# Patient Record
Sex: Female | Born: 1965 | Race: White | Hispanic: No | Marital: Married | Smoking: Never smoker
Health system: Southern US, Community
[De-identification: ages and names within clinical notes are randomized; demographics above are authoritative.]

## PROBLEM LIST (undated history)

## (undated) DIAGNOSIS — J45909 Unspecified asthma, uncomplicated: Secondary | ICD-10-CM

## (undated) DIAGNOSIS — N83209 Unspecified ovarian cyst, unspecified side: Secondary | ICD-10-CM

## (undated) DIAGNOSIS — Z86010 Personal history of colonic polyps: Secondary | ICD-10-CM

## (undated) HISTORY — PX: TONSILLECTOMY AND ADENOIDECTOMY: SUR1326

## (undated) HISTORY — DX: Personal history of colonic polyps: Z86.010

## (undated) HISTORY — DX: Unspecified ovarian cyst, unspecified side: N83.209

## (undated) HISTORY — DX: Unspecified asthma, uncomplicated: J45.909

---

## 2002-01-14 ENCOUNTER — Encounter: Payer: Self-pay | Admitting: Family Medicine

## 2002-01-16 ENCOUNTER — Encounter: Payer: Self-pay | Admitting: Family Medicine

## 2006-06-02 ENCOUNTER — Encounter: Payer: Self-pay | Admitting: Family Medicine

## 2007-03-25 ENCOUNTER — Encounter: Payer: Self-pay | Admitting: Family Medicine

## 2007-04-01 ENCOUNTER — Encounter (INDEPENDENT_AMBULATORY_CARE_PROVIDER_SITE_OTHER): Payer: Self-pay | Admitting: *Deleted

## 2007-05-11 ENCOUNTER — Ambulatory Visit: Payer: Self-pay | Admitting: Family Medicine

## 2007-05-11 ENCOUNTER — Encounter: Payer: Self-pay | Admitting: Family Medicine

## 2007-05-11 ENCOUNTER — Other Ambulatory Visit: Admission: RE | Admit: 2007-05-11 | Discharge: 2007-05-11 | Payer: Self-pay | Admitting: Family Medicine

## 2007-05-20 ENCOUNTER — Encounter (INDEPENDENT_AMBULATORY_CARE_PROVIDER_SITE_OTHER): Payer: Self-pay | Admitting: *Deleted

## 2007-05-25 LAB — CONVERTED CEMR LAB
AST: 37 units/L (ref 0–37)
Bilirubin, Direct: 0.1 mg/dL (ref 0.0–0.3)
CO2: 30 meq/L (ref 19–32)
Chloride: 103 meq/L (ref 96–112)
Creatinine, Ser: 0.8 mg/dL (ref 0.4–1.2)
Direct LDL: 128.2 mg/dL
Eosinophils Relative: 2.8 % (ref 0.0–5.0)
Glucose, Bld: 93 mg/dL (ref 70–99)
HCT: 44 % (ref 36.0–46.0)
Hemoglobin: 14.6 g/dL (ref 12.0–15.0)
Neutrophils Relative %: 40.3 % — ABNORMAL LOW (ref 43.0–77.0)
RBC: 4.8 M/uL (ref 3.87–5.11)
RDW: 12.3 % (ref 11.5–14.6)
Sodium: 139 meq/L (ref 135–145)
Total Bilirubin: 0.8 mg/dL (ref 0.3–1.2)
Total CHOL/HDL Ratio: 2.7
Total Protein: 7.4 g/dL (ref 6.0–8.3)
Triglycerides: 75 mg/dL (ref 0–149)
WBC: 7.5 10*3/uL (ref 4.5–10.5)

## 2007-06-10 ENCOUNTER — Encounter: Payer: Self-pay | Admitting: Family Medicine

## 2007-06-23 ENCOUNTER — Telehealth (INDEPENDENT_AMBULATORY_CARE_PROVIDER_SITE_OTHER): Payer: Self-pay | Admitting: *Deleted

## 2007-06-30 ENCOUNTER — Encounter: Payer: Self-pay | Admitting: Family Medicine

## 2008-11-02 ENCOUNTER — Ambulatory Visit: Payer: Self-pay | Admitting: Family Medicine

## 2008-11-02 DIAGNOSIS — K602 Anal fissure, unspecified: Secondary | ICD-10-CM | POA: Insufficient documentation

## 2009-02-06 ENCOUNTER — Encounter: Payer: Self-pay | Admitting: Family Medicine

## 2009-03-05 ENCOUNTER — Ambulatory Visit: Payer: Self-pay | Admitting: Family Medicine

## 2009-03-05 ENCOUNTER — Other Ambulatory Visit: Admission: RE | Admit: 2009-03-05 | Discharge: 2009-03-05 | Payer: Self-pay | Admitting: Family Medicine

## 2009-08-21 ENCOUNTER — Encounter: Payer: Self-pay | Admitting: Family Medicine

## 2010-04-22 NOTE — Miscellaneous (Signed)
Summary: mammogram normal  Clinical Lists Changes  Observations: Added new observation of MAMMRECACT: Screening mammogram in 1 year.    (08/20/2009 16:57) Added new observation of MAMMOGRAM: Location: [facility].   No specific mammographic evidence of malignancy.  No significant changes compared to previous study.  Assessment: BIRADS 1.  (08/20/2009 16:57)      Mammogram  Procedure date:  08/20/2009  Findings:      Location: [facility].   No specific mammographic evidence of malignancy.  No significant changes compared to previous study.  Assessment: BIRADS 1.   Comments:      Screening mammogram in 1 year.      Mammogram  Procedure date:  08/20/2009  Findings:      Location: [facility].   No specific mammographic evidence of malignancy.  No significant changes compared to previous study.  Assessment: BIRADS 1.   Comments:      Screening mammogram in 1 year.

## 2010-09-10 ENCOUNTER — Encounter: Payer: Self-pay | Admitting: Family Medicine

## 2011-04-02 ENCOUNTER — Ambulatory Visit (INDEPENDENT_AMBULATORY_CARE_PROVIDER_SITE_OTHER): Payer: PRIVATE HEALTH INSURANCE | Admitting: Family Medicine

## 2011-04-02 ENCOUNTER — Other Ambulatory Visit (HOSPITAL_COMMUNITY)
Admission: RE | Admit: 2011-04-02 | Discharge: 2011-04-02 | Disposition: A | Discharge status: Home / Self Care | Payer: PRIVATE HEALTH INSURANCE | Source: Ambulatory Visit | Attending: Family Medicine | Admitting: Family Medicine

## 2011-04-02 VITALS — BP 118/78 | HR 68 | Temp 98.1°F | Ht 67.0 in | Wt 144.0 lb

## 2011-04-02 DIAGNOSIS — Z01419 Encounter for gynecological examination (general) (routine) without abnormal findings: Secondary | ICD-10-CM | POA: Insufficient documentation

## 2011-04-02 DIAGNOSIS — Z0000 Encounter for general adult medical examination without abnormal findings: Secondary | ICD-10-CM

## 2011-04-02 DIAGNOSIS — Z1159 Encounter for screening for other viral diseases: Secondary | ICD-10-CM | POA: Insufficient documentation

## 2011-04-02 MED ORDER — NUVAIL EX SOLN: 1.0000 "application " | Freq: Every day | CUTANEOUS | Status: DC

## 2011-04-02 NOTE — Patient Instructions (Signed)
Start a regular exercise program and make sure you are eating a healthy diet Try to eat 4 servings of dairy a day or take a calcium supplement (500mg twice a day). Your vaccines are up to date.  We will call you with your lab results. If you don't here from us in about a week then please give us a call at [phone].  

## 2011-04-02 NOTE — Progress Notes (Signed)
Subjective:    Patient ID: Shannon Carr, female    DOB: 08-12-1965, 46 y.o.   MRN: [id number]  HPI   Review of Systems     Objective:   Physical Exam        Assessment & Plan:   Subjective:     Shannon Carr is a 46 y.o. female and is here for a comprehensive physical exam. The patient reports problems - urethritis for 2 weeks.  right flank pain.  REcenlty had. She reported urethritis for about 2 weeks. She said she probably did a urine dipstick and it showed protein and blood. Her symptoms seemed to resolve completely so she never actually treated herself for infection. She read that her urine although later and it still showed protein but no blood. She finally did a thorough dipstick that was normal. She was questioning whether or not the test strips may have been faulty at work. She's also had some intermittent right flank pain but she feels it is musculoskeletal. She has been playing a lot of tennis and she is right-handed. She says it is worse with movement and with deep palpation. She does have a history of a kidney stone during one of her pregnancies but has not had any problems since then. She denies any gross hematuria. No history of abnormal Pap smears. She says her mammogram was 2 months ago and was normal. She recently had 2 friends that were diagnosed leukemia she is nervous about this.  She would like to try new product called iNuvail. She is very brittle nails and would like to try using this to see if this helps.  History   Social History  . Marital Status: Married    Spouse Name: N/A    Number of Children: N/A  . Years of Education: N/A   Occupational History  . Not on file.   Social History Main Topics  . Smoking status: Not on file  . Smokeless tobacco: Not on file  . Alcohol Use: Not on file  . Drug Use: Not on file  . Sexually Active: Not on file   Other Topics Concern  . Not on file   Social History Narrative  . No narrative on file   Health  Maintenance  Topic Date Due  . Pap Smear  12/07/1983  . Influenza Vaccine  12/22/2010  . Mammogram  08/26/2011  . Tetanus/tdap  05/10/2017    The following portions of the patient's history were reviewed and updated as appropriate: allergies, current medications, past family history, past medical history, past social history, past surgical history and problem list.  Review of Systems A comprehensive review of systems was negative.   Objective:    BP 118/78  Pulse 68  Temp(Src) 98.1 F (36.7 C) (Oral)  Ht 5\' 7"  (1.702 m)  Wt 144 lb (65.318 kg)  BMI 22.55 kg/m2 General appearance: alert, cooperative and appears stated age Head: Normocephalic, without obvious abnormality, atraumatic Eyes: Conjunctiva clear, extraocular movements intact, pupils equal round reactive to light. Ears: normal TM's and external ear canals both ears Nose: Nares normal. Septum midline. Mucosa normal. No drainage or sinus tenderness. Throat: lips, mucosa, and tongue normal; teeth and gums normal Neck: no adenopathy, no carotid bruit, no JVD, supple, symmetrical, trachea midline and thyroid not enlarged, symmetric, no tenderness/mass/nodules Back: symmetric, no curvature. ROM normal. No CVA tenderness. Lungs: clear to auscultation bilaterally Breasts: normal appearance, no masses or tenderness Heart: regular rate and rhythm, S1, S2 normal, no murmur, click, rub  or gallop Abdomen: soft, non-tender; bowel sounds normal; no masses,  no organomegaly Pelvic: cervix normal in appearance, external genitalia normal, no adnexal masses or tenderness, no cervical motion tenderness, rectovaginal septum normal, uterus normal size, shape, and consistency and vagina normal without discharge Extremities: extremities normal, atraumatic, no cyanosis or edema Pulses: 2+ and symmetric Skin: Skin color, texture, turgor normal. No rashes or lesions Lymph nodes: Cervical, supraclavicular, and axillary nodes normal. Neurologic: Grossly  normal    Assessment:    Healthy female exam.  Plan:     See After Visit Summary for Counseling Recommendations  Start a regular exercise program and make sure you are eating a healthy diet Try to eat 4 servings of dairy a day or take a calcium supplement (500mg  twice a day). Your vaccines are up to date.   Brittle nails-we will try the new product called Nuvail. It not sure about the causes of the new medication. Asked to let me know she feels it works.  Microscopic hematuria/urinary-recommend repeating a urinalysis today with her physical.  Due for screening cholesterol. She also requests that we check a CBC.  She also complains of stress incontinence which she has had for years since having children. She went up any recommendations for urologic surgeons in the area. I gave her the name of the physician Durwin Nora a physician in [place] that she might consider. She is at the point that she would like to have surgical intervention before she becomes postmenopausal.

## 2011-04-03 LAB — CBC
MCH: 30.7 pg (ref 26.0–34.0)
MCHC: 34.2 g/dL (ref 30.0–36.0)
Platelets: 231 10*3/uL (ref 150–400)
RDW: 13 % (ref 11.5–15.5)

## 2011-04-03 LAB — URINALYSIS, ROUTINE W REFLEX MICROSCOPIC
Hgb urine dipstick: NEGATIVE
Ketones, ur: NEGATIVE mg/dL
Leukocytes, UA: NEGATIVE
Nitrite: NEGATIVE
Protein, ur: NEGATIVE mg/dL

## 2011-04-03 LAB — COMPLETE METABOLIC PANEL WITH GFR
AST: 21 U/L (ref 0–37)
Albumin: 5 g/dL (ref 3.5–5.2)
Alkaline Phosphatase: 50 U/L (ref 39–117)
BUN: 16 mg/dL (ref 6–23)
Creat: 0.85 mg/dL (ref 0.50–1.10)
Potassium: 4.3 mEq/L (ref 3.5–5.3)
Total Bilirubin: 0.8 mg/dL (ref 0.3–1.2)

## 2011-04-03 LAB — LIPID PANEL
Cholesterol: 229 mg/dL — ABNORMAL HIGH (ref 0–200)
HDL: 86 mg/dL (ref >39)
Total CHOL/HDL Ratio: 2.7 Ratio
Triglycerides: 59 mg/dL (ref <150)
VLDL: 12 mg/dL (ref 0–40)

## 2011-04-06 ENCOUNTER — Encounter: Payer: Self-pay | Admitting: Family Medicine

## 2011-04-06 DIAGNOSIS — N393 Stress incontinence (female) (male): Secondary | ICD-10-CM | POA: Insufficient documentation

## 2012-04-05 ENCOUNTER — Telehealth: Payer: Self-pay | Admitting: Family Medicine

## 2012-04-05 ENCOUNTER — Ambulatory Visit (INDEPENDENT_AMBULATORY_CARE_PROVIDER_SITE_OTHER): Payer: PRIVATE HEALTH INSURANCE | Admitting: Family Medicine

## 2012-04-05 ENCOUNTER — Encounter: Payer: Self-pay | Admitting: Family Medicine

## 2012-04-05 ENCOUNTER — Other Ambulatory Visit (HOSPITAL_COMMUNITY)
Admission: RE | Admit: 2012-04-05 | Discharge: 2012-04-05 | Disposition: A | Discharge status: Home / Self Care | Payer: PRIVATE HEALTH INSURANCE | Source: Ambulatory Visit | Attending: Family Medicine | Admitting: Family Medicine

## 2012-04-05 VITALS — BP 108/65 | HR 74 | Resp 18 | Ht 66.5 in | Wt 145.0 lb

## 2012-04-05 DIAGNOSIS — Z1151 Encounter for screening for human papillomavirus (HPV): Secondary | ICD-10-CM | POA: Insufficient documentation

## 2012-04-05 DIAGNOSIS — Z01419 Encounter for gynecological examination (general) (routine) without abnormal findings: Secondary | ICD-10-CM | POA: Insufficient documentation

## 2012-04-05 DIAGNOSIS — A609 Anogenital herpesviral infection, unspecified: Secondary | ICD-10-CM | POA: Insufficient documentation

## 2012-04-05 DIAGNOSIS — N393 Stress incontinence (female) (male): Secondary | ICD-10-CM

## 2012-04-05 DIAGNOSIS — Z23 Encounter for immunization: Secondary | ICD-10-CM

## 2012-04-05 DIAGNOSIS — Z124 Encounter for screening for malignant neoplasm of cervix: Secondary | ICD-10-CM

## 2012-04-05 MED ORDER — ACYCLOVIR 400 MG PO TABS: 800.0000 mg | ORAL_TABLET | Freq: Two times a day (BID) | ORAL | Status: DC

## 2012-04-05 NOTE — Telephone Encounter (Signed)
Please call pt with Dr. Ellin Saba name and number.  SHe wants to check it out before she makes appt.

## 2012-04-05 NOTE — Progress Notes (Signed)
Subjective:     Shannon Carr is a 47 y.o. female and is here for a comprehensive physical exam. The patient reports problems - having stress incontinenece. HAs to pee before she runs. Has never had surgery but is interested now. she has a hx of HSV.  Has been years since has had an outbreak.Marland Kitchen  History   Social History  . Marital Status: Married    Spouse Name: N/A    Number of Children: N/A  . Years of Education: N/A   Occupational History  . Nurse practitioner    Social History Main Topics  . Smoking status: Never Smoker   . Smokeless tobacco: Not on file  . Alcohol Use: Yes  . Drug Use: Not on file  . Sexually Active: Yes -- Female partner(s)   Other Topics Concern  . Not on file   Social History Narrative  . No narrative on file   Health Maintenance  Topic Date Due  . Mammogram  10/28/2012  . Influenza Vaccine  11/21/2012  . Pap Smear  04/01/2014  . Tetanus/tdap  05/10/2017    The following portions of the patient's history were reviewed and updated as appropriate: allergies, current medications, past family history, past medical history, past social history, past surgical history and problem list.  Review of Systems A comprehensive review of systems was negative.   Objective:    BP 108/65  Pulse 74  Resp 18  Ht 5' 6.5" (1.689 m)  Wt 145 lb (65.772 kg)  BMI 23.05 kg/m2  SpO2 98%  LMP 03/24/2012 General appearance: alert, cooperative and appears stated age Head: Normocephalic, without obvious abnormality, atraumatic Eyes: conj clear, EOMi, PEERLA Ears: normal TM's and external ear canals both ears Nose: Nares normal. Septum midline. Mucosa normal. No drainage or sinus tenderness. Throat: lips, mucosa, and tongue normal; teeth and gums normal Neck: no adenopathy, no carotid bruit, no JVD, supple, symmetrical, trachea midline and thyroid not enlarged, symmetric, no tenderness/mass/nodules Back: symmetric, no curvature. ROM normal. No CVA tenderness. Lungs: clear  to auscultation bilaterally Breasts: normal appearance, no masses or tenderness Heart: regular rate and rhythm, S1, S2 normal, no murmur, click, rub or gallop Abdomen: soft, non-tender; bowel sounds normal; no masses,  no organomegaly Pelvic: cervix normal in appearance, external genitalia normal, no adnexal masses or tenderness, no cervical motion tenderness, rectovaginal septum normal, uterus normal size, shape, and consistency and vagina normal without discharge Extremities: extremities normal, atraumatic, no cyanosis or edema Pulses: 2+ and symmetric Skin: Skin color, texture, turgor normal. No rashes or lesions Lymph nodes: Cervical, supraclavicular, and axillary nodes normal. Neurologic: Alert and oriented X 3, normal strength and tone. Normal symmetric reflexes. Normal coordination and gait    Assessment:    Healthy female exam.    Plan:     See After Visit Summary for Counseling Recommendations  Keep up a regular exercise program and make sure you are eating a healthy diet Try to eat 4 servings of dairy a day, or if you are lactose intolerant take a calcium with vitamin D daily.  Your vaccines are up to date.  Flu vaccine and mammogram are up-to-date. Pap smear performed today. We will call with results in a week. She had full labs last year and wants to hold off on doing this this year. Also encouraged her to log on to my chart.  HSV - prescription sent for generic acyclovir to use on a when necessary basis.  Stress incontinence-I will try to get some additional  information for her for specialist the area that focus primarily on pelvic reconstruction. We'll contact her as soon as I get that information.  She does have a tiny approximately 2-3 mm skin colored lesion at the 5:00 position around the rectum. It does not appear to be a cyst. There is no sign of erythema or infection. It is not pedunculated. It also does not appear to be wart-like.  I encouraged her to keep an eye  on it and call if she has any problems or discomfort or if it changes in size. Gave reassurance.

## 2012-04-05 NOTE — Telephone Encounter (Signed)
Please call GYn down the hall. Please see who Dr. Marice Potter recommends for pelvic reconstruction.  See if she knows someone who specalizes in this in the area.

## 2012-04-05 NOTE — Patient Instructions (Addendum)
Keep up a regular exercise program and make sure you are eating a healthy diet Try to eat 4 servings of dairy a day, or if you are lactose intolerant take a calcium with vitamin D daily.  Your vaccines are up to date.   

## 2012-04-05 NOTE — Telephone Encounter (Signed)
Dr. Marice Potter does do Pelvic reconstruction , just send the referral to her, PER Tonya in their office. Thanks, [employer]

## 2012-04-05 NOTE — Telephone Encounter (Signed)
I called and left a message for Dr. Marice Potter asking who she recommends for Pelvic reconstruction and IF she knows of anyone? Thanks

## 2012-04-06 NOTE — Telephone Encounter (Signed)
I called patient as you requested and gave her Dr.Dove's name, number and location. Patient told me to tell Dr. Linford Arnold thank you for getting back with her about this and checking on it for her. Thanks, [employer]

## 2012-09-21 ENCOUNTER — Other Ambulatory Visit: Payer: Self-pay | Admitting: *Deleted

## 2012-09-21 MED ORDER — ACYCLOVIR 400 MG PO TABS: 800.0000 mg | ORAL_TABLET | Freq: Two times a day (BID) | ORAL | Status: DC

## 2012-11-28 ENCOUNTER — Encounter: Payer: Self-pay | Admitting: Family Medicine

## 2012-12-21 HISTORY — PX: ANTERIOR AND POSTERIOR REPAIR: SHX1172

## 2014-02-06 ENCOUNTER — Telehealth: Payer: Self-pay | Admitting: Family Medicine

## 2014-02-06 DIAGNOSIS — Z114 Encounter for screening for human immunodeficiency virus [HIV]: Secondary | ICD-10-CM

## 2014-02-06 DIAGNOSIS — Z1159 Encounter for screening for other viral diseases: Secondary | ICD-10-CM

## 2014-02-06 NOTE — Telephone Encounter (Signed)
Ms. Azzarello called. 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

## 2014-02-06 NOTE — Telephone Encounter (Signed)
lvm informing pt that order has been placed.Shannon Carr [place]

## 2014-02-07 ENCOUNTER — Other Ambulatory Visit: Payer: Self-pay | Admitting: *Deleted

## 2014-02-07 DIAGNOSIS — Z114 Encounter for screening for human immunodeficiency virus [HIV]: Secondary | ICD-10-CM

## 2014-02-07 DIAGNOSIS — Z1159 Encounter for screening for other viral diseases: Secondary | ICD-10-CM

## 2014-02-08 LAB — HIV ANTIBODY (ROUTINE TESTING W REFLEX): HIV 1&2 Ab, 4th Generation: NONREACTIVE

## 2014-03-07 ENCOUNTER — Encounter: Payer: Self-pay | Admitting: Family Medicine

## 2014-04-19 ENCOUNTER — Encounter: Payer: PRIVATE HEALTH INSURANCE | Admitting: Family Medicine

## 2014-06-20 ENCOUNTER — Ambulatory Visit (INDEPENDENT_AMBULATORY_CARE_PROVIDER_SITE_OTHER): Payer: PRIVATE HEALTH INSURANCE | Admitting: Family Medicine

## 2014-06-20 ENCOUNTER — Encounter: Payer: Self-pay | Admitting: Family Medicine

## 2014-06-20 ENCOUNTER — Other Ambulatory Visit (HOSPITAL_COMMUNITY)
Admission: RE | Admit: 2014-06-20 | Discharge: 2014-06-20 | Disposition: A | Discharge status: Home / Self Care | Payer: PRIVATE HEALTH INSURANCE | Source: Ambulatory Visit | Attending: Family Medicine | Admitting: Family Medicine

## 2014-06-20 VITALS — BP 119/74 | HR 76 | Ht 67.0 in | Wt 138.0 lb

## 2014-06-20 DIAGNOSIS — L819 Disorder of pigmentation, unspecified: Secondary | ICD-10-CM | POA: Diagnosis not present

## 2014-06-20 DIAGNOSIS — Z01411 Encounter for gynecological examination (general) (routine) with abnormal findings: Secondary | ICD-10-CM | POA: Insufficient documentation

## 2014-06-20 DIAGNOSIS — L709 Acne, unspecified: Secondary | ICD-10-CM | POA: Diagnosis not present

## 2014-06-20 DIAGNOSIS — Z1151 Encounter for screening for human papillomavirus (HPV): Secondary | ICD-10-CM | POA: Diagnosis present

## 2014-06-20 DIAGNOSIS — Z01419 Encounter for gynecological examination (general) (routine) without abnormal findings: Secondary | ICD-10-CM

## 2014-06-20 DIAGNOSIS — L814 Other melanin hyperpigmentation: Secondary | ICD-10-CM

## 2014-06-20 MED ORDER — ACYCLOVIR 400 MG PO TABS: 800.0000 mg | ORAL_TABLET | Freq: Two times a day (BID) | ORAL | Status: DC | PRN

## 2014-06-20 MED ORDER — TRETINOIN 0.05 % EX CREA: TOPICAL_CREAM | Freq: Every day | CUTANEOUS | Status: DC

## 2014-06-20 MED ORDER — LUSTRA-AF 4 % EX CREA: 1.0000 "application " | TOPICAL_CREAM | Freq: Every day | CUTANEOUS | Status: DC

## 2014-06-20 NOTE — Patient Instructions (Signed)
Keep up a regular exercise program and make sure you are eating a healthy diet Try to eat 4 servings of dairy a day, or if you are lactose intolerant take a calcium with vitamin D daily.  Your vaccines are up to date.   

## 2014-06-20 NOTE — Progress Notes (Signed)
  Subjective:     Shannon Carr is a 49 y.o. female and is here for a comprehensive physical exam. The patient reports no problems.  History   Social History  . Marital Status: Married    Spouse Name: N/A  . Number of Children: 3  . Years of Education: N/A   Occupational History  . Nurse practitioner    Social History Main Topics  . Smoking status: Never Smoker   . Smokeless tobacco: Not on file  . Alcohol Use: Yes  . Drug Use: No  . Sexual Activity:    Partners: Male   Other Topics Concern  . Not on file   Social History Narrative   Work in [employer]. Works out regularly.    Health Maintenance  Topic Date Due  . INFLUENZA VACCINE  10/21/2013  . MAMMOGRAM  02/07/2015  . PAP SMEAR  04/06/2015  . TETANUS/TDAP  05/10/2017  . HIV Screening  Completed    The following portions of the patient's history were reviewed and updated as appropriate: allergies, current medications, past family history, past medical history, past social history, past surgical history and problem list.  Review of Systems A comprehensive review of systems was negative.   Objective:    BP 119/74 mmHg  Pulse 76  Ht 5\' 7"  (1.702 m)  Wt 138 lb (62.596 kg)  BMI 21.61 kg/m2 General appearance: alert, cooperative and appears stated age Head: Normocephalic, without obvious abnormality, atraumatic Eyes: conj clear, EOMI, PEERLA Ears: normal TM's and external ear canals both ears Nose: Nares normal. Septum midline. Mucosa normal. No drainage or sinus tenderness. Throat: lips, mucosa, and tongue normal; teeth and gums normal Neck: no adenopathy, no carotid bruit, no JVD, supple, symmetrical, trachea midline and thyroid not enlarged, symmetric, no tenderness/mass/nodules Back: symmetric, no curvature. ROM normal. No CVA tenderness. Lungs: clear to auscultation bilaterally Breasts: normal appearance, no masses or tenderness Heart: regular rate and rhythm, S1, S2 normal, no murmur, click, rub or  gallop Abdomen: soft, non-tender; bowel sounds normal; no masses,  no organomegaly Pelvic: cervix normal in appearance, external genitalia normal, no adnexal masses or tenderness, no cervical motion tenderness, rectovaginal septum normal, uterus normal size, shape, and consistency and vagina normal without discharge Extremities: extremities normal, atraumatic, no cyanosis or edema Pulses: 2+ and symmetric Skin: Skin color, texture, turgor normal. No rashes or lesions Lymph nodes: Cervical, supraclavicular, and axillary nodes normal. Neurologic: Alert and oriented X 3, normal strength and tone. Normal symmetric reflexes. Normal coordination and gait    Assessment:    Healthy female exam.      Plan:     See After Visit Summary for Counseling Recommendations  Keep up a regular exercise program and make sure you are eating a healthy diet Try to eat 4 servings of dairy a day, or if you are lactose intolerant take a calcium with vitamin D daily.  Your vaccines are up to date.    Acne/solar lentigo - would like refill on retin-A and lustra-AF.

## 2014-06-21 ENCOUNTER — Telehealth: Payer: Self-pay | Admitting: Family Medicine

## 2014-06-21 NOTE — Telephone Encounter (Signed)
Received fax for pa on trentinoin and sent through cover my meds waiting on auth. - CF

## 2014-06-22 LAB — CYTOLOGY - PAP

## 2014-06-25 NOTE — Telephone Encounter (Signed)
Called [employer] and got the trentinoin 0.05 % Cream approved Auth # [id number] from 06/25/2014 - 06/25/2015 - CF

## 2014-06-25 NOTE — Telephone Encounter (Signed)
Received fax on Tretinoin and request has been denied - CF

## 2015-03-21 ENCOUNTER — Encounter: Payer: Self-pay | Admitting: Family Medicine

## 2015-03-24 HISTORY — PX: COLONOSCOPY: SHX174

## 2015-05-28 ENCOUNTER — Ambulatory Visit (INDEPENDENT_AMBULATORY_CARE_PROVIDER_SITE_OTHER): Payer: PRIVATE HEALTH INSURANCE

## 2015-05-28 ENCOUNTER — Encounter: Payer: Self-pay | Admitting: Family Medicine

## 2015-05-28 ENCOUNTER — Ambulatory Visit (INDEPENDENT_AMBULATORY_CARE_PROVIDER_SITE_OTHER): Payer: PRIVATE HEALTH INSURANCE | Admitting: Family Medicine

## 2015-05-28 VITALS — BP 106/75 | HR 62 | Temp 98.2°F | Ht 66.5 in | Wt 134.0 lb

## 2015-05-28 DIAGNOSIS — A609 Anogenital herpesviral infection, unspecified: Secondary | ICD-10-CM

## 2015-05-28 DIAGNOSIS — R1032 Left lower quadrant pain: Secondary | ICD-10-CM

## 2015-05-28 DIAGNOSIS — Z1211 Encounter for screening for malignant neoplasm of colon: Secondary | ICD-10-CM | POA: Diagnosis not present

## 2015-05-28 DIAGNOSIS — Z0000 Encounter for general adult medical examination without abnormal findings: Secondary | ICD-10-CM

## 2015-05-28 LAB — COMPLETE METABOLIC PANEL WITH GFR
ALBUMIN: 4.6 g/dL (ref 3.6–5.1)
ALK PHOS: 41 U/L (ref 33–115)
ALT: 15 U/L (ref 6–29)
AST: 16 U/L (ref 10–35)
BUN: 11 mg/dL (ref 7–25)
CALCIUM: 9.7 mg/dL (ref 8.6–10.2)
CHLORIDE: 101 mmol/L (ref 98–110)
CO2: 24 mmol/L (ref 20–31)
CREATININE: 1.02 mg/dL (ref 0.50–1.10)
GFR, Est African American: 75 mL/min (ref >=60)
GFR, Est Non African American: 65 mL/min (ref >=60)
GLUCOSE: 89 mg/dL (ref 65–99)
Potassium: 3.9 mmol/L (ref 3.5–5.3)
SODIUM: 139 mmol/L (ref 135–146)
Total Bilirubin: 0.9 mg/dL (ref 0.2–1.2)
Total Protein: 7.3 g/dL (ref 6.1–8.1)

## 2015-05-28 LAB — CBC WITH DIFFERENTIAL/PLATELET
Basophils Absolute: 0 10*3/uL (ref 0.0–0.1)
Basophils Relative: 0 % (ref 0–1)
EOS PCT: 2 % (ref 0–5)
Eosinophils Absolute: 0.2 10*3/uL (ref 0.0–0.7)
HEMATOCRIT: 43.2 % (ref 36.0–46.0)
Hemoglobin: 15 g/dL (ref 12.0–15.0)
LYMPHS ABS: 3.9 10*3/uL (ref 0.7–4.0)
Lymphocytes Relative: 44 % (ref 12–46)
MCH: 30.9 pg (ref 26.0–34.0)
MCHC: 34.7 g/dL (ref 30.0–36.0)
MCV: 88.9 fL (ref 78.0–100.0)
MONO ABS: 0.9 10*3/uL (ref 0.1–1.0)
MPV: 11.2 fL (ref 8.6–12.4)
Monocytes Relative: 10 % (ref 3–12)
NEUTROS PCT: 44 % (ref 43–77)
Neutro Abs: 3.9 10*3/uL (ref 1.7–7.7)
Platelets: 217 10*3/uL (ref 150–400)
RBC: 4.86 MIL/uL (ref 3.87–5.11)
RDW: 13.2 % (ref 11.5–15.5)
WBC: 8.8 10*3/uL (ref 4.0–10.5)

## 2015-05-28 LAB — LIPID PANEL
CHOL/HDL RATIO: 2.2 ratio (ref <=5.0)
CHOLESTEROL: 222 mg/dL — AB (ref 125–200)
HDL: 103 mg/dL (ref >=46)
LDL Cholesterol: 104 mg/dL (ref <130)
Triglycerides: 76 mg/dL (ref <150)
VLDL: 15 mg/dL (ref <30)

## 2015-05-28 LAB — TSH: TSH: 2.15 mIU/L

## 2015-05-28 MED ORDER — ACYCLOVIR 400 MG PO TABS: 800.0000 mg | ORAL_TABLET | Freq: Two times a day (BID) | ORAL | Status: DC | PRN

## 2015-05-28 NOTE — Progress Notes (Signed)
  Subjective:     Shannon Carr is a 50 y.o. female and is here for a comprehensive physical exam. The patient reports problems - pain in her LLQ on and  off for 6 months. Feels similar to [name].  Periods have been regular.   She has had a full skin cancer screening.    She has had her mammogram. Recall in 6 months.     Social History   Social History  . Marital Status: Married    Spouse Name: N/A  . Number of Children: 3  . Years of Education: N/A   Occupational History  . Nurse practitioner    Social History Main Topics  . Smoking status: Never Smoker   . Smokeless tobacco: Not on file  . Alcohol Use: Yes  . Drug Use: No  . Sexual Activity:    Partners: Male   Other Topics Concern  . Not on file   Social History Narrative   Work in [employer]. Works out regularly.    Health Maintenance  Topic Date Due  . INFLUENZA VACCINE  10/22/2015  . MAMMOGRAM  02/21/2016  . TETANUS/TDAP  05/10/2017  . PAP SMEAR  06/19/2017  . HIV Screening  Completed    The following portions of the patient's history were reviewed and updated as appropriate: allergies, current medications, past family history, past medical history, past social history, past surgical history and problem list.  Review of Systems A comprehensive review of systems was negative.   Objective:    BP 106/75 mmHg  Pulse 62  Temp(Src) 98.2 F (36.8 C)  Wt 134 lb (60.782 kg)  SpO2 98% General appearance: alert, cooperative and appears stated age Head: Normocephalic, without obvious abnormality, atraumatic Eyes: conj clear, EOMi, PEERLA Ears: normal TM's and external ear canals both ears Nose: Nares normal. Septum midline. Mucosa normal. No drainage or sinus tenderness. Throat: lips, mucosa, and tongue normal; teeth and gums normal Neck: no adenopathy, no carotid bruit, no JVD, supple, symmetrical, trachea midline and thyroid not enlarged, symmetric, no tenderness/mass/nodules Back: symmetric, no  curvature. ROM normal. No CVA tenderness. Lungs: clear to auscultation bilaterally Breasts: normal appearance, no masses or tenderness Heart: regular rate and rhythm, S1, S2 normal, no murmur, click, rub or gallop Abdomen: soft, non-tender; bowel sounds normal; no masses,  no organomegaly Pelvic: cervix normal in appearance, external genitalia normal, no adnexal masses or tenderness, no cervical motion tenderness, rectovaginal septum normal, uterus normal size, shape, and consistency and vagina normal without discharge Extremities: extremities normal, atraumatic, no cyanosis or edema Pulses: 2+ and symmetric Skin: Skin color, texture, turgor normal. No rashes or lesions Lymph nodes: Cervical, supraclavicular, and axillary nodes normal. Neurologic: Alert and oriented X 3, normal strength and tone. Normal symmetric reflexes. Normal coordination and gait    Assessment:    Healthy female exam.      Plan:     See After Visit Summary for Counseling Recommendations    Keep up a regular exercise program and make sure you are eating a healthy diet Try to eat 4 servings of dairy a day, or if you are lactose intolerant take a calcium with vitamin D daily.  Your vaccines are up to date.   LLQ pain - will schedule for [place] for further evaluation.     Discussed need for colon cancer screening.  Will refer.

## 2015-05-28 NOTE — Patient Instructions (Signed)
Keep up a regular exercise program and make sure you are eating a healthy diet Try to eat 4 servings of dairy a day, or if you are lactose intolerant take a calcium with vitamin D daily.  Your vaccines are up to date.   

## 2015-09-03 ENCOUNTER — Other Ambulatory Visit: Payer: Self-pay | Admitting: Family Medicine

## 2015-09-03 DIAGNOSIS — R928 Other abnormal and inconclusive findings on diagnostic imaging of breast: Secondary | ICD-10-CM

## 2015-09-03 NOTE — Progress Notes (Signed)
Per [facility] request.

## 2015-09-09 LAB — HM MAMMOGRAPHY

## 2015-10-03 ENCOUNTER — Encounter: Payer: Self-pay | Admitting: Family Medicine

## 2015-12-23 ENCOUNTER — Encounter: Payer: Self-pay | Admitting: Internal Medicine

## 2015-12-23 ENCOUNTER — Telehealth: Payer: Self-pay | Admitting: Family Medicine

## 2015-12-23 DIAGNOSIS — Z1211 Encounter for screening for malignant neoplasm of colon: Secondary | ICD-10-CM

## 2015-12-23 NOTE — Telephone Encounter (Signed)
Pt called to request a referral for colonoscopy. Pt states her deductible has been met so would like to go ahead and get this completed. Will place order.

## 2016-01-02 ENCOUNTER — Ambulatory Visit (INDEPENDENT_AMBULATORY_CARE_PROVIDER_SITE_OTHER): Payer: PRIVATE HEALTH INSURANCE

## 2016-01-02 ENCOUNTER — Encounter: Payer: Self-pay | Admitting: Podiatry

## 2016-01-02 ENCOUNTER — Ambulatory Visit (INDEPENDENT_AMBULATORY_CARE_PROVIDER_SITE_OTHER): Payer: PRIVATE HEALTH INSURANCE | Admitting: Podiatry

## 2016-01-02 VITALS — BP 115/78 | HR 64 | Resp 16

## 2016-01-02 DIAGNOSIS — M79671 Pain in right foot: Secondary | ICD-10-CM | POA: Diagnosis not present

## 2016-01-02 DIAGNOSIS — M722 Plantar fascial fibromatosis: Secondary | ICD-10-CM

## 2016-01-02 DIAGNOSIS — M779 Enthesopathy, unspecified: Principal | ICD-10-CM

## 2016-01-02 DIAGNOSIS — M778 Other enthesopathies, not elsewhere classified: Secondary | ICD-10-CM

## 2016-01-02 DIAGNOSIS — M79672 Pain in left foot: Secondary | ICD-10-CM

## 2016-01-02 DIAGNOSIS — M7752 Other enthesopathy of left foot: Secondary | ICD-10-CM | POA: Diagnosis not present

## 2016-01-02 NOTE — Progress Notes (Signed)
   Subjective:    Patient ID: Shannon Carr, female    DOB: 09/25/1965, 50 y.o.   MRN: [id number]  HPI: She presents today with a chief complaint of pain to the first metatarsophalangeal joint of the left foot. She states that has been aching for the past several weeks and noticed it primarily after doing high-impact forefoot activity for exercise. She states that she has pain on range of motion. She states that over the past week or so it has started to resolve and her symptoms are lessened. She is also complaining of pain to bilateral heels. She states that she has known morning pain but her heels disorder feel squishy she states that they've been tender for the past few weeks but she can states that they have her for months.    Review of Systems  All other systems reviewed and are negative.      Objective:   Physical Exam: Vital signs are stable alert and oriented 3. Pulses are strongly palpable. Neurologic sensorium is intact. Deep tendon reflexes are intact and muscle strength +5 over 5 dorsiflexion plantar flexors and inverters everters onto the musculature is intact. Orthopedic evaluation demonstrates all joints distal to the ankle for range of motion without crepitation. Cutaneous evaluation demonstrates supple well-hydrated cutis no open lesions or wounds. She has very little in the way of pain on palpation or range of motion of the first metatarsophalangeal joint left that there is some tenderness on palpation of the distracted joint. She also has some tenderness and bogginess on palpation of the plantar medial calcaneal tubercle and central calcaneal tubercle bilateral heels. Radiographs do demonstrate 3 views mature individual today soft tissue increase in density at the plantar fascia calcaneal insertion site of the left heel. This is minimal and there are no other signs of joint space narrowing fracture etc.        Assessment & Plan:  Assessment: Plantar fasciitis and probable  sesamoiditis with capsulitis that has resolved.  Plan: She was scanned for set of orthotics and I will follow-up with her in 1 month.

## 2016-02-04 ENCOUNTER — Ambulatory Visit (INDEPENDENT_AMBULATORY_CARE_PROVIDER_SITE_OTHER): Payer: PRIVATE HEALTH INSURANCE | Admitting: Podiatry

## 2016-02-04 DIAGNOSIS — M722 Plantar fascial fibromatosis: Secondary | ICD-10-CM

## 2016-02-04 NOTE — Patient Instructions (Signed)

## 2016-02-05 NOTE — Progress Notes (Signed)
She presents today for follow-up of her plantar fasciitis she states that she's doing pretty well in her orthotics that she just received today feels great. She was provided with both oral and ongoing instructions for care of these orthotics and I will follow-up with her in 4-5 weeks. Currently she states they feel very good.

## 2016-02-11 ENCOUNTER — Ambulatory Visit (AMBULATORY_SURGERY_CENTER): Payer: Self-pay | Admitting: *Deleted

## 2016-02-11 VITALS — Ht 67.0 in | Wt 146.0 lb

## 2016-02-11 DIAGNOSIS — Z1211 Encounter for screening for malignant neoplasm of colon: Secondary | ICD-10-CM

## 2016-02-11 NOTE — Progress Notes (Signed)
Patient denies any allergies to eggs or soy. Patient denies any problems with anesthesia/sedation. Patient denies any oxygen use at home and does not take any diet/weight loss medications. Patient declined EMMI education. 

## 2016-02-24 ENCOUNTER — Ambulatory Visit (AMBULATORY_SURGERY_CENTER): Payer: PRIVATE HEALTH INSURANCE | Admitting: Internal Medicine

## 2016-02-24 ENCOUNTER — Encounter: Payer: Self-pay | Admitting: Internal Medicine

## 2016-02-24 VITALS — BP 105/71 | HR 54 | Temp 98.0°F | Resp 11 | Ht 67.0 in | Wt 146.0 lb

## 2016-02-24 DIAGNOSIS — Z1211 Encounter for screening for malignant neoplasm of colon: Secondary | ICD-10-CM | POA: Diagnosis not present

## 2016-02-24 DIAGNOSIS — Z1212 Encounter for screening for malignant neoplasm of rectum: Secondary | ICD-10-CM | POA: Diagnosis not present

## 2016-02-24 DIAGNOSIS — D120 Benign neoplasm of cecum: Secondary | ICD-10-CM

## 2016-02-24 DIAGNOSIS — D122 Benign neoplasm of ascending colon: Secondary | ICD-10-CM

## 2016-02-24 DIAGNOSIS — D123 Benign neoplasm of transverse colon: Secondary | ICD-10-CM | POA: Diagnosis not present

## 2016-02-24 MED ORDER — SODIUM CHLORIDE 0.9 % IV SOLN: 500.0000 mL | INTRAVENOUS | Status: DC

## 2016-02-24 NOTE — Op Note (Signed)
[facility] Patient Name: Shannon Carr Procedure Date: 02/24/2016 1:22 PM MRN: [id number] Endoscopist: Gatha Mayer , MD Age: 50 Referring MD:  Date of Birth: 1965-09-18 Gender: Female Account #: [id number] Procedure:                Colonoscopy Indications:              Screening for colorectal malignant neoplasm, This                            is the patient's first colonoscopy Medicines:                Propofol per Anesthesia, Monitored Anesthesia Care Procedure:                Pre-Anesthesia Assessment:                           - Prior to the procedure, a History and Physical                            was performed, and patient medications and                            allergies were reviewed. The patient's tolerance of                            previous anesthesia was also reviewed. The risks                            and benefits of the procedure and the sedation                            options and risks were discussed with the patient.                            All questions were answered, and informed consent                            was obtained. Prior Anticoagulants: The patient has                            taken no previous anticoagulant or antiplatelet                            agents. ASA Grade Assessment: II - A patient with                            mild systemic disease. After reviewing the risks                            and benefits, the patient was deemed in                            satisfactory condition to undergo the procedure.  After obtaining informed consent, the colonoscope                            was passed under direct vision. Throughout the                            procedure, the patient's blood pressure, pulse, and                            oxygen saturations were monitored continuously. The                            Model CF-HQ190L [id number]) scope was introduced   through the anus and advanced to the the cecum,                            identified by appendiceal orifice and ileocecal                            valve. The colonoscopy was performed without                            difficulty. The patient tolerated the procedure                            well. The quality of the bowel preparation was                            excellent. The bowel preparation used was Miralax.                            The ileocecal valve, appendiceal orifice, and                            rectum were photographed. Scope In: 1:34:46 PM Scope Out: 2:00:01 PM Scope Withdrawal Time: 0 hours 18 minutes 51 seconds  Total Procedure Duration: 0 hours 25 minutes 15 seconds  Findings:                 The perianal and digital rectal examinations were                            normal.                           Six flat and sessile polyps were found in the                            transverse colon, ascending colon and cecum. The                            polyps were 5 to 10 mm in size. These polyps were                            removed with a cold  snare. Resection and retrieval                            were complete. Verification of patient                            identification for the specimen was done. Estimated                            blood loss was minimal.                           The exam was otherwise without abnormality on                            direct and retroflexion views. Complications:            No immediate complications. Estimated Blood Loss:     Estimated blood loss was minimal. Impression:               - Six 5 to 10 mm polyps in the transverse colon, in                            the ascending colon and in the cecum, removed with                            a cold snare. Resected and retrieved.                           - The examination was otherwise normal on direct                            and retroflexion views. Recommendation:            - Patient has a contact number available for                            emergencies. The signs and symptoms of potential                            delayed complications were discussed with the                            patient. Return to normal activities tomorrow.                            Written discharge instructions were provided to the                            patient.                           - Resume previous diet.                           - Continue present medications.                           -  Repeat colonoscopy is recommended. The                            colonoscopy date will be determined after pathology                            results from today's exam become available for                            review. Gatha Mayer, MD 02/24/2016 2:12:56 PM This report has been signed electronically.

## 2016-02-24 NOTE — Patient Instructions (Addendum)
I found and removed 6 polyps - all look benign. I will let you know pathology results and when to have another routine colonoscopy by mail.  I appreciate the opportunity to care for you. Gatha Mayer, MD, FACG   YOU HAD AN ENDOSCOPIC PROCEDURE TODAY AT [facility]:   Refer to the procedure report that was given to you for any specific questions about what was found during the examination.  If the procedure report does not answer your questions, please call your gastroenterologist to clarify.  If you requested that your care partner not be given the details of your procedure findings, then the procedure report has been included in a sealed envelope for you to review at your convenience later.  YOU SHOULD EXPECT: Some feelings of bloating in the abdomen. Passage of more gas than usual.  Walking can help get rid of the air that was put into your GI tract during the procedure and reduce the bloating. If you had a lower endoscopy (such as a colonoscopy or flexible sigmoidoscopy) you may notice spotting of blood in your stool or on the toilet paper. If you underwent a bowel prep for your procedure, you may not have a normal bowel movement for a few days.  Please Note:  You might notice some irritation and congestion in your nose or some drainage.  This is from the oxygen used during your procedure.  There is no need for concern and it should clear up in a day or so.  SYMPTOMS TO REPORT IMMEDIATELY:   Following lower endoscopy (colonoscopy or flexible sigmoidoscopy):  Excessive amounts of blood in the stool  Significant tenderness or worsening of abdominal pains  Swelling of the abdomen that is new, acute  Fever of 100F or higher   For urgent or emergent issues, a gastroenterologist can be reached at any hour by calling [phone].   DIET:  We do recommend a small meal at first, but then you may proceed to your regular diet.  Drink plenty of fluids but you should  avoid alcoholic beverages for 24 hours.  ACTIVITY:  You should plan to take it easy for the rest of today and you should NOT DRIVE or use heavy machinery until tomorrow (because of the sedation medicines used during the test).    FOLLOW UP: Our staff will call the number listed on your records the next business day following your procedure to check on you and address any questions or concerns that you may have regarding the information given to you following your procedure. If we do not reach you, we will leave a message.  However, if you are feeling well and you are not experiencing any problems, there is no need to return our call.  We will assume that you have returned to your regular daily activities without incident.  If any biopsies were taken you will be contacted by phone or by letter within the next 1-3 weeks.  Please call us at [phone] if you have not heard about the biopsies in 3 weeks.    SIGNATURES/CONFIDENTIALITY: You and/or your care partner have signed paperwork which will be entered into your electronic medical record.  These signatures attest to the fact that that the information above on your After Visit Summary has been reviewed and is understood.  Full responsibility of the confidentiality of this discharge information lies with you and/or your care-partner.  Read all of the handouts given to you by your recovery room  nurse.

## 2016-02-24 NOTE — Progress Notes (Signed)
Report to PACU, RN, vss, BBS= Clear.  

## 2016-02-24 NOTE — Progress Notes (Signed)
Called to room to assist during endoscopic procedure.  Patient ID and intended procedure confirmed with present staff. Received instructions for my participation in the procedure from the performing physician.  

## 2016-02-25 ENCOUNTER — Telehealth: Payer: Self-pay | Admitting: *Deleted

## 2016-02-25 NOTE — Telephone Encounter (Signed)
  Follow up Call-  Call back number 02/24/2016  Post procedure Call Back phone  # [phone]  Permission to leave phone message Yes  Some recent data might be hidden     Patient questions:  Do you have a fever, pain , or abdominal swelling? No. Pain Score  0 *  Have you tolerated food without any problems? Yes.    Have you been able to return to your normal activities? Yes.    Do you have any questions about your discharge instructions: Diet   No. Medications  No. Follow up visit  No.  Do you have questions or concerns about your Care? No.  Actions: * If pain score is 4 or above: No action needed, pain <4.

## 2016-02-27 ENCOUNTER — Encounter: Payer: Self-pay | Admitting: Internal Medicine

## 2016-02-27 DIAGNOSIS — Z86010 Personal history of colonic polyps: Secondary | ICD-10-CM

## 2016-02-27 DIAGNOSIS — Z860101 Personal history of adenomatous and serrated colon polyps: Secondary | ICD-10-CM

## 2016-02-27 HISTORY — DX: Personal history of colonic polyps: Z86.010

## 2016-02-27 HISTORY — DX: Personal history of adenomatous and serrated colon polyps: Z86.0101

## 2016-02-27 NOTE — Progress Notes (Signed)
6 adenomas max 10 mm Recall colonoscopy 2020 My Chart note to patient no letter

## 2016-04-11 IMAGING — US US TRANSVAGINAL NON-OB
1 series · 14 of 25 positions shown · non-contrast
Comparison: None

CLINICAL DATA: Patient with left lower quadrant pain for 6 months.

EXAM:
TRANSABDOMINAL AND TRANSVAGINAL ULTRASOUND OF PELVIS
TECHNIQUE: Both transabdominal and transvaginal ultrasound examinations of the
pelvis were performed. Transabdominal technique was performed for
global imaging of the pelvis including uterus, ovaries, adnexal
regions, and pelvic cul-de-sac. It was necessary to proceed with
endovaginal exam following the transabdominal exam to visualize the
adnexal structures.

[Series 1: us transvaginal non-ob · 0.22mm/px · 14 of 69 slices shown]
[im 1/69]
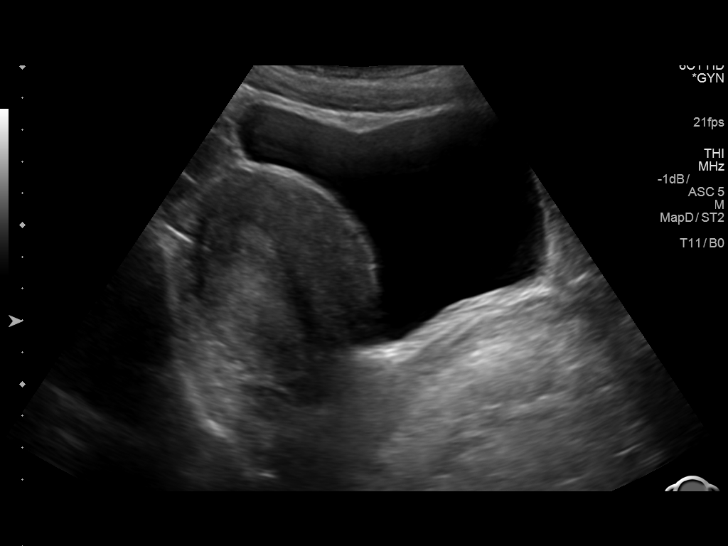
[im 6/69]
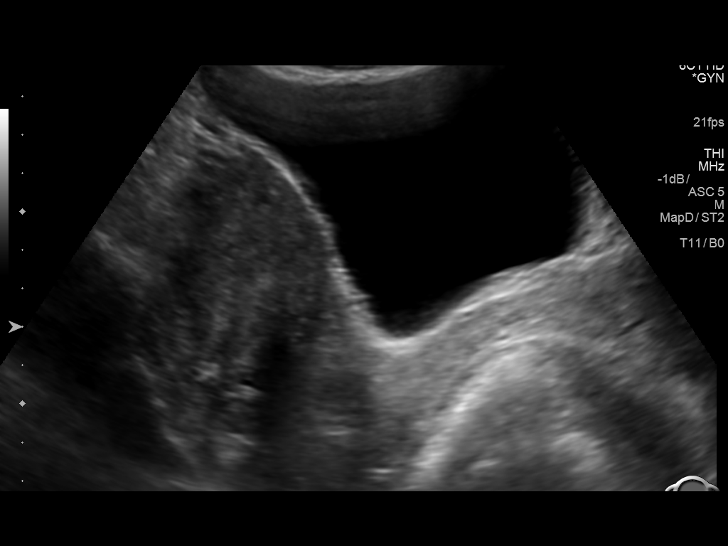
[im 12/69]
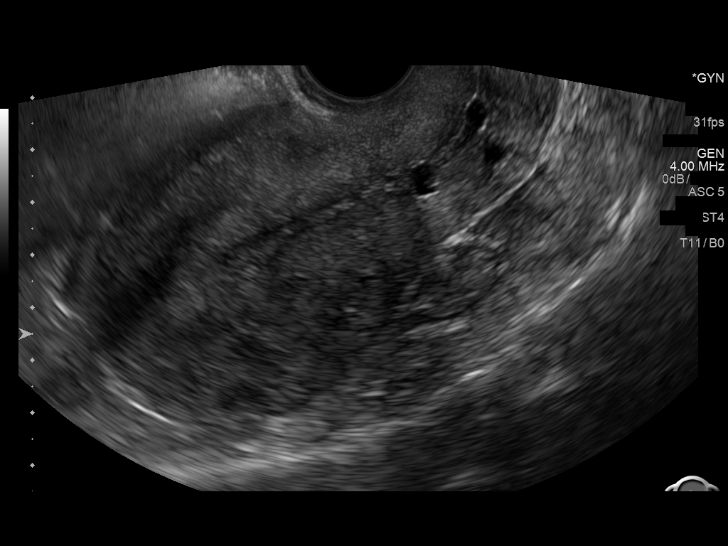
[im 18/69]
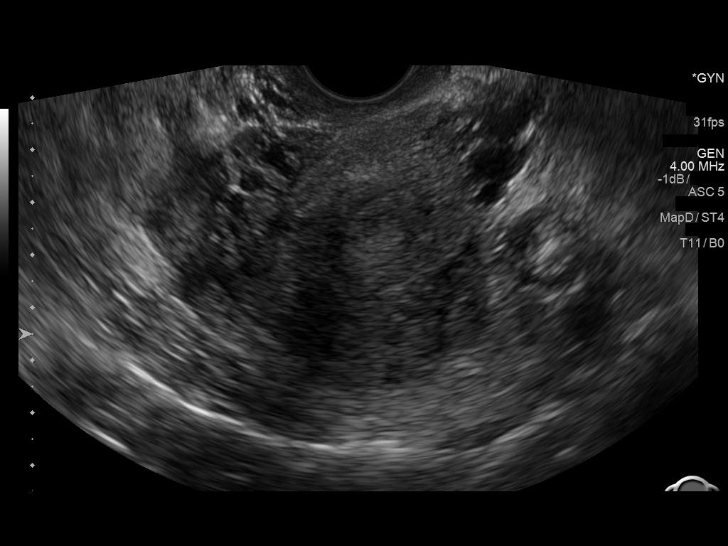
[im 23/69]
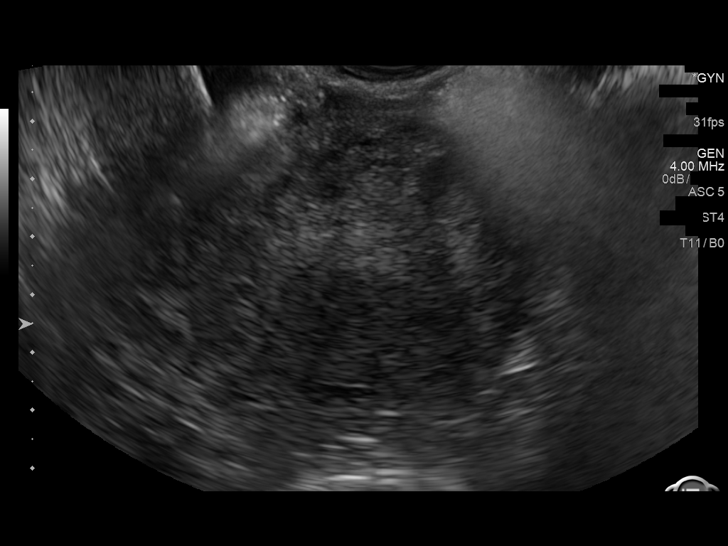
[im 26/69]
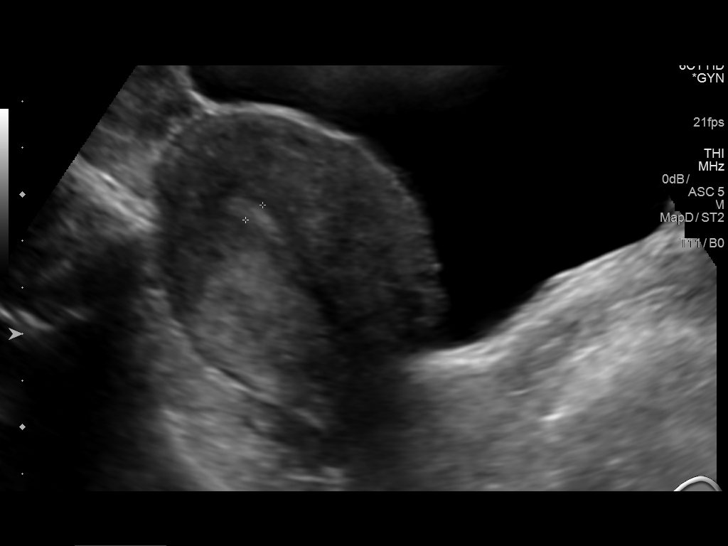
[im 32/69]
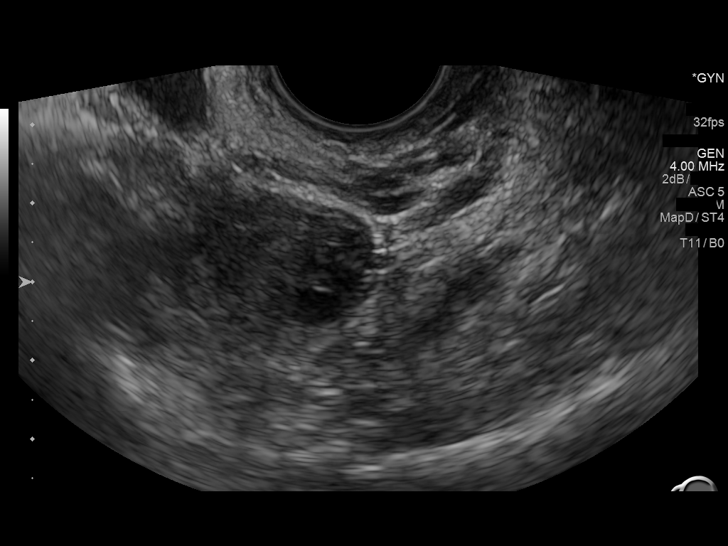
[im 37/69]
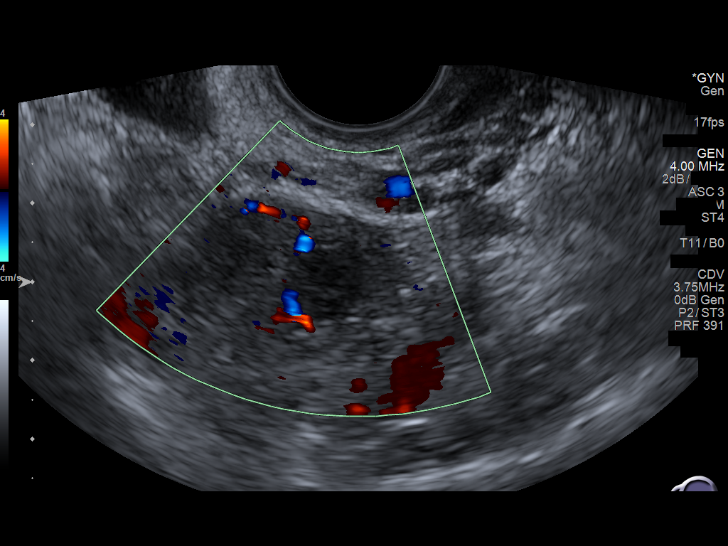
[im 43/69]
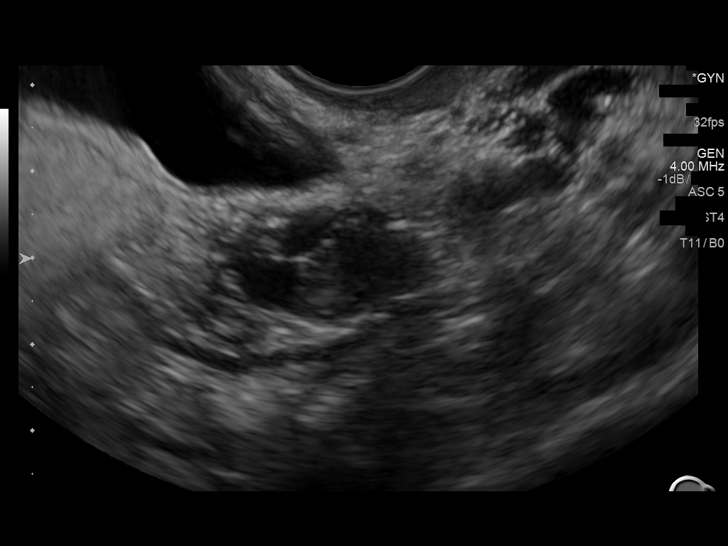
[im 46/69]
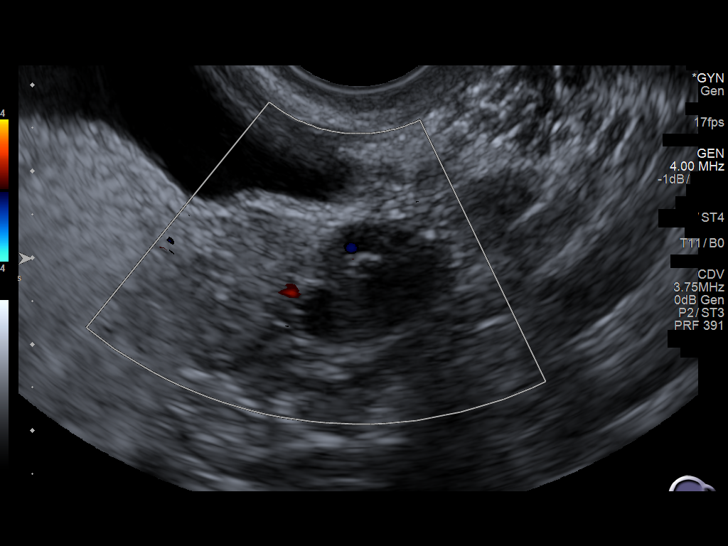
[im 52/69]
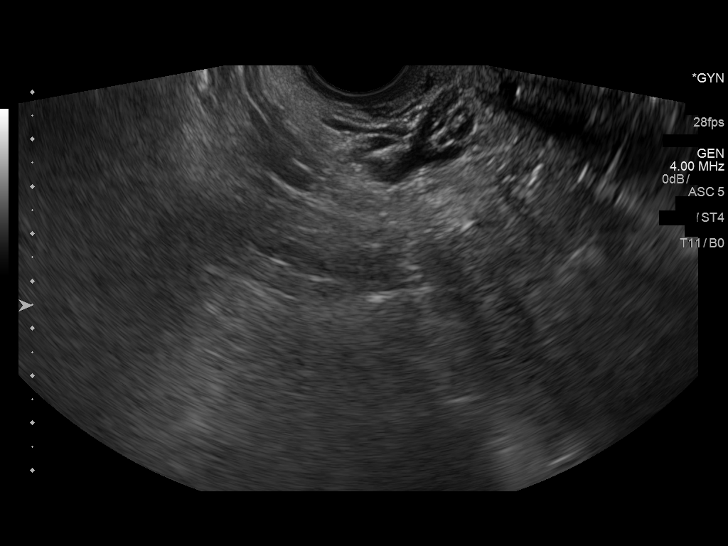
[im 57/69]
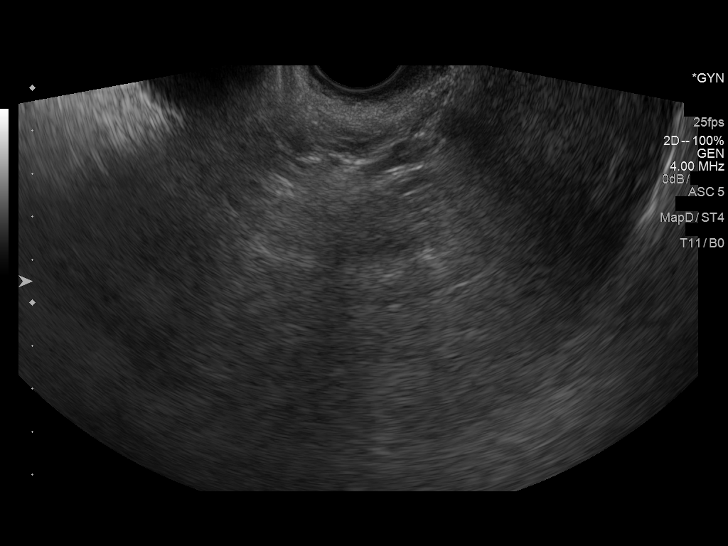
[im 63/69]
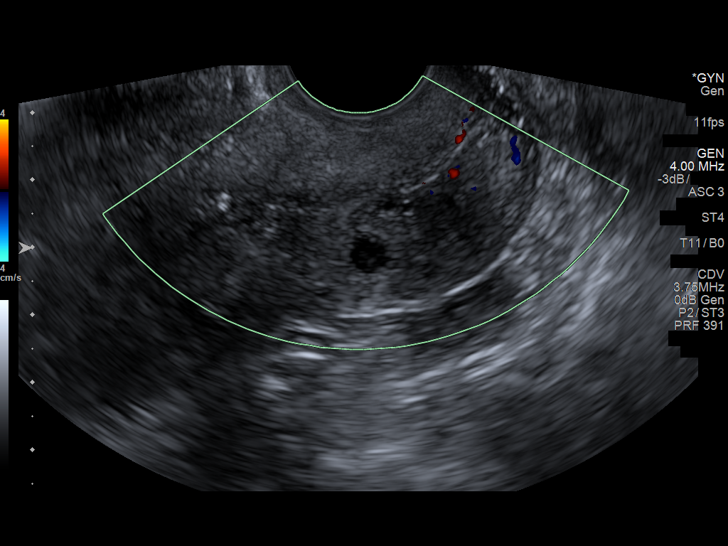
[im 69/69]
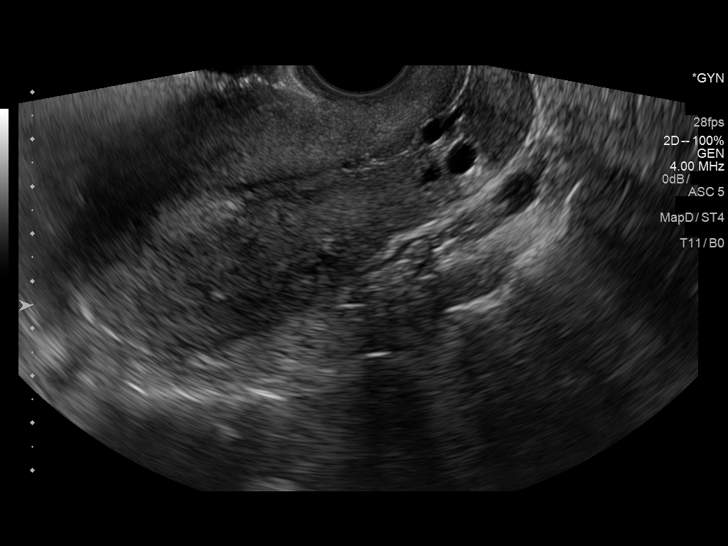

[14 of 25 positions shown; findings below may reference images not displayed]

FINDINGS: Uterus

Measurements: 10.8 x 5.6 x 6.2 cm. No fibroids or other mass
visualized.

Endometrium

Thickness: 11 mm.  No focal abnormality visualized.

Right ovary

Measurements: 2.7 x 1.9 x 2.0 cm. Normal appearance/no adnexal mass.

Left ovary

Measurements: 2.6 x 1.4 x 2.0 cm. Normal appearance/no adnexal mass.

Other findings

No abnormal free fluid.
IMPRESSION: Unremarkable pelvic ultrasound.

## 2016-04-29 LAB — HM MAMMOGRAPHY

## 2016-05-08 ENCOUNTER — Encounter: Payer: Self-pay | Admitting: Family Medicine

## 2016-09-01 ENCOUNTER — Encounter: Payer: Self-pay | Admitting: Family Medicine

## 2016-09-01 ENCOUNTER — Ambulatory Visit (INDEPENDENT_AMBULATORY_CARE_PROVIDER_SITE_OTHER): Payer: PRIVATE HEALTH INSURANCE | Admitting: Family Medicine

## 2016-09-01 VITALS — BP 130/76 | HR 64 | Ht 66.14 in | Wt 139.0 lb

## 2016-09-01 DIAGNOSIS — C4491 Basal cell carcinoma of skin, unspecified: Secondary | ICD-10-CM | POA: Diagnosis not present

## 2016-09-01 DIAGNOSIS — L989 Disorder of the skin and subcutaneous tissue, unspecified: Secondary | ICD-10-CM | POA: Diagnosis not present

## 2016-09-01 NOTE — Progress Notes (Addendum)
   Subjective:    Patient ID: Shannon Carr, female    DOB: [birth year], 51 y.o.   MRN: [id number]  HPI 51 year old female comes in today because of a lesion on her chest.She noticed it more recently. She does have first-degree relative with melanoma and just wanted to make sure. She thinks is just a seborrheic keratosis but wanted a biopsy. No pain irritation or tenderness of the lesion.     Review of Systems     Objective:   Physical Exam  Constitutional: She is oriented to person, place, and time. She appears well-developed and well-nourished.  HENT:  Head: Normocephalic and atraumatic.  Eyes: Conjunctivae and EOM are normal.  Cardiovascular: Normal rate.   Pulmonary/Chest: Effort normal.  Neurological: She is alert and oriented to person, place, and time.  Skin: Skin is dry. No pallor.  4 x 5 mm round lesion that is skin colored. The center of it's a little bit more rough but in the edges are very smooth.  Psychiatric: She has a normal mood and affect. Her behavior is normal.  Vitals reviewed.         Assessment & Plan:  Atypical lesion - suspicious for possible basal cell.  Shave bx performed.  F/U wound care discussed. Return if any sign of infection. Apply Aquaphor daily.    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 Treatment options including curettage, seeing her dermatologist and using an automated for 6 weeks. Patient opted to use the liquid and then follow-up with her dermatologist later this summer.  Shave Biopsy Procedure Note  Pre-operative Diagnosis: Suspicious lesion  Post-operative Diagnosis: same  Locations:right upper chest  Indications: new lesion  Anesthesia: upper anterior chest without added sodium bicarbonate  Procedure Details  Patient informed of the risks (including bleeding and infection) and benefits of the  procedure and Verbal informed consent obtained.  The lesion and surrounding area were given a sterile prep using chlorhexidine  and draped in the usual sterile fashion. A scalpel was used to shave an area of skin approximately 48mm by 92mm.  Hemostasis achieved with alumuninum chloride. Antibiotic ointment and a sterile dressing applied.  The specimen was sent for pathologic examination. The patient tolerated the procedure well.  EBL: trace  Findings: Await pathology  Condition: Stable  Complications: none.  Plan: 1. Instructed to keep the wound dry and covered for 24-48h and clean thereafter. 2. Warning signs of infection were reviewed.   3. Recommended that the patient use OTC acetaminophen as needed for pain.  4. Return PRN.

## 2016-09-01 NOTE — Patient Instructions (Addendum)
Okay to remove bandage tomorrow. Okay to get wet in the shower. Do not scrub the area. Pat dry and apply small amount of Aquaphor or Vaseline. Do not use any alcohol or peroxide products on the lesion. If you have any concerns at all about wound healing them please give [place] a call back. We should have your biopsy report back in 5 business days.

## 2016-09-04 ENCOUNTER — Telehealth: Payer: Self-pay

## 2016-09-04 MED ORDER — IMIQUIMOD 5 % EX CREA: TOPICAL_CREAM | CUTANEOUS | 1 refills | Status: DC

## 2016-09-04 NOTE — Telephone Encounter (Signed)
Patient called stated that she would like Aldara cream sent to [facility] pharmacy and she will follow up with dermatology at some point. Yanin Muhlestein,CMA

## 2016-09-04 NOTE — Addendum Note (Signed)
Addended by: Beatrice Lecher D on: 09/04/2016 05:58 PM   Modules accepted: Orders

## 2016-09-07 MED ORDER — IMIQUIMOD 5 % EX CREA: TOPICAL_CREAM | CUTANEOUS | 1 refills | Status: DC

## 2016-09-07 NOTE — Telephone Encounter (Signed)
Medication has been sent.Shannon Carr'

## 2016-09-07 NOTE — Addendum Note (Signed)
Addended by: Teddy Spike on: 09/07/2016 05:03 PM   Modules accepted: Orders

## 2016-10-27 ENCOUNTER — Encounter: Payer: Self-pay | Admitting: Family Medicine

## 2016-10-27 ENCOUNTER — Ambulatory Visit (INDEPENDENT_AMBULATORY_CARE_PROVIDER_SITE_OTHER): Payer: PRIVATE HEALTH INSURANCE | Admitting: Family Medicine

## 2016-10-27 ENCOUNTER — Other Ambulatory Visit (HOSPITAL_COMMUNITY)
Admission: RE | Admit: 2016-10-27 | Discharge: 2016-10-27 | Disposition: A | Discharge status: Home / Self Care | Payer: PRIVATE HEALTH INSURANCE | Source: Ambulatory Visit | Attending: Family Medicine | Admitting: Family Medicine

## 2016-10-27 VITALS — BP 138/74 | HR 69 | Wt 142.0 lb

## 2016-10-27 DIAGNOSIS — R030 Elevated blood-pressure reading, without diagnosis of hypertension: Secondary | ICD-10-CM

## 2016-10-27 DIAGNOSIS — Z124 Encounter for screening for malignant neoplasm of cervix: Secondary | ICD-10-CM | POA: Diagnosis not present

## 2016-10-27 DIAGNOSIS — Z0000 Encounter for general adult medical examination without abnormal findings: Secondary | ICD-10-CM

## 2016-10-27 DIAGNOSIS — N870 Mild cervical dysplasia: Secondary | ICD-10-CM | POA: Insufficient documentation

## 2016-10-27 NOTE — Patient Instructions (Signed)
Keep up a regular exercise program and make sure you are eating a healthy diet Try to eat 4 servings of dairy a day, or if you are lactose intolerant take a calcium with vitamin D daily.  Your vaccines are up to date.   

## 2016-10-27 NOTE — Progress Notes (Signed)
Subjective:     Shannon Carr is a 51 y.o. female and is here for a comprehensive physical exam. The patient reports problems - lacerated her heel over the summer so hasn't been able to exercise. she is otherwise doing well. No specific concerns.  Mammogram, colonoscopy up-to-date. Vaccines up-to-date.    Social History   Social History  . Marital status: Married    Spouse name: N/A  . Number of children: 3  . Years of education: N/A   Occupational History  . Nurse practitioner    Social History Main Topics  . Smoking status: Never Smoker  . Smokeless tobacco: Never Used  . Alcohol use Yes     Comment: weekly-social  . Drug use: No  . Sexual activity: Yes    Partners: Male   Other Topics Concern  . Not on file   Social History Narrative   FNP   Work in [employer]. Works out regularly.    Health Maintenance  Topic Date Due  . INFLUENZA VACCINE  10/21/2016  . MAMMOGRAM  04/29/2017  . TETANUS/TDAP  05/10/2017  . PAP SMEAR  06/19/2017  . COLONOSCOPY  02/24/2019  . HIV Screening  Completed    The following portions of the patient's history were reviewed and updated as appropriate: allergies, current medications, past family history, past medical history, past social history, past surgical history and problem list.  Review of Systems A comprehensive review of systems was negative.   Objective:    There were no vitals taken for this visit. General appearance: alert, cooperative and appears stated age Head: Normocephalic, without obvious abnormality, atraumatic Eyes: conj clear, EOMi, PEERLA Ears: normal TM's and external ear canals both ears Nose: Nares normal. Septum midline. Mucosa normal. No drainage or sinus tenderness. Throat: lips, mucosa, and tongue normal; teeth and gums normal Neck: no adenopathy, no carotid bruit, no JVD, supple, symmetrical, trachea midline and thyroid not enlarged, symmetric, no tenderness/mass/nodules Back: symmetric, no curvature. ROM  normal. No CVA tenderness. Lungs: clear to auscultation bilaterally Breasts: normal appearance, no masses or tenderness Heart: regular rate and rhythm, S1, S2 normal, no murmur, click, rub or gallop Abdomen: soft, non-tender; bowel sounds normal; no masses,  no organomegaly Pelvic: cervix normal in appearance, external genitalia normal, no adnexal masses or tenderness, no cervical motion tenderness, rectovaginal septum normal, uterus normal size, shape, and consistency and vagina normal without discharge Extremities: extremities normal, atraumatic, no cyanosis or edema Pulses: 2+ and symmetric Skin: Skin color, texture, turgor normal. No rashes or lesions Lymph nodes: Cervical, supraclavicular, and axillary nodes normal. Neurologic: Alert and oriented X 3, normal strength and tone. Normal symmetric reflexes. Normal coordination and gait    Assessment:    Healthy female exam.      Plan:     See After Visit Summary for Counseling Recommendations   Keep up a regular exercise program and make sure you are eating a healthy diet Try to eat 4 servings of dairy a day, or if you are lactose intolerant take a calcium with vitamin D daily.  Your vaccines are up to date.  Previous Pap smear with ASCUS.  Elevated blood pressure-her blood pressure was a little elevated on her recent emergency department visit and again when she first arrived her today. Recheck was better. 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

## 2016-11-03 LAB — CYTOLOGY - PAP: HPV: NOT DETECTED

## 2016-11-19 ENCOUNTER — Encounter: Payer: PRIVATE HEALTH INSURANCE | Admitting: Obstetrics & Gynecology

## 2016-12-04 ENCOUNTER — Other Ambulatory Visit: Payer: Self-pay | Admitting: Family Medicine

## 2016-12-04 DIAGNOSIS — A609 Anogenital herpesviral infection, unspecified: Secondary | ICD-10-CM

## 2016-12-08 ENCOUNTER — Encounter: Payer: PRIVATE HEALTH INSURANCE | Admitting: Obstetrics & Gynecology

## 2016-12-14 ENCOUNTER — Encounter: Payer: Self-pay | Admitting: *Deleted

## 2016-12-14 ENCOUNTER — Encounter: Payer: Self-pay | Admitting: Obstetrics & Gynecology

## 2016-12-14 ENCOUNTER — Ambulatory Visit (INDEPENDENT_AMBULATORY_CARE_PROVIDER_SITE_OTHER): Payer: PRIVATE HEALTH INSURANCE | Admitting: Obstetrics & Gynecology

## 2016-12-14 VITALS — BP 139/90 | HR 99 | Resp 16 | Ht 66.0 in | Wt 146.0 lb

## 2016-12-14 DIAGNOSIS — R87612 Low grade squamous intraepithelial lesion on cytologic smear of cervix (LGSIL): Secondary | ICD-10-CM

## 2016-12-14 DIAGNOSIS — R87619 Unspecified abnormal cytological findings in specimens from cervix uteri: Secondary | ICD-10-CM

## 2016-12-14 DIAGNOSIS — Z3202 Encounter for pregnancy test, result negative: Secondary | ICD-10-CM | POA: Diagnosis not present

## 2016-12-14 LAB — POCT URINE PREGNANCY: Preg Test, Ur: NEGATIVE

## 2016-12-14 NOTE — Progress Notes (Signed)
   Subjective:    Patient ID: Shannon Carr, female    DOB: [birth year], 51 y.o.   MRN: [id number]  HPI Here for a colpo due to LGSIL pap this summer. Her pap in 2016 was ASCUS with negative HPV, previous pap was negative.   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 [place] around 51 years old    Objective:   Physical Exam Well nourished, well hydrated white female, no apparent distress Breathing, conversing, and ambulating normally UPT negative, consent signed, time out done Cervix prepped with acetic acid. Transformation zone seen in its entirety. Colpo adequate. Entirely normal colpo ECC obtained. She tolerated the procedure well.      Assessment & Plan:  LGSIL pap- normal colpo Await pathology

## 2016-12-18 ENCOUNTER — Telehealth: Payer: Self-pay | Admitting: *Deleted

## 2016-12-18 NOTE — Telephone Encounter (Signed)
Pt notified of neg ECC and pt will have repeat pap in 1 year.

## 2017-04-21 DIAGNOSIS — D225 Melanocytic nevi of trunk: Secondary | ICD-10-CM | POA: Diagnosis not present

## 2017-04-21 DIAGNOSIS — L821 Other seborrheic keratosis: Secondary | ICD-10-CM | POA: Diagnosis not present

## 2017-04-21 DIAGNOSIS — L814 Other melanin hyperpigmentation: Secondary | ICD-10-CM | POA: Diagnosis not present

## 2017-04-21 DIAGNOSIS — D1801 Hemangioma of skin and subcutaneous tissue: Secondary | ICD-10-CM | POA: Diagnosis not present

## 2017-04-21 DIAGNOSIS — L820 Inflamed seborrheic keratosis: Secondary | ICD-10-CM | POA: Diagnosis not present

## 2017-05-03 DIAGNOSIS — Z1231 Encounter for screening mammogram for malignant neoplasm of breast: Secondary | ICD-10-CM | POA: Diagnosis not present

## 2017-05-03 LAB — HM MAMMOGRAPHY

## 2017-06-28 ENCOUNTER — Emergency Department (INDEPENDENT_AMBULATORY_CARE_PROVIDER_SITE_OTHER)
Admission: EM | Admit: 2017-06-28 | Discharge: 2017-06-28 | Disposition: A | Discharge status: Home / Self Care | Payer: BLUE CROSS/BLUE SHIELD | Source: Home / Self Care

## 2017-06-28 ENCOUNTER — Encounter: Payer: Self-pay | Admitting: Emergency Medicine

## 2017-06-28 ENCOUNTER — Emergency Department (INDEPENDENT_AMBULATORY_CARE_PROVIDER_SITE_OTHER): Payer: BLUE CROSS/BLUE SHIELD

## 2017-06-28 DIAGNOSIS — J4541 Moderate persistent asthma with (acute) exacerbation: Secondary | ICD-10-CM

## 2017-06-28 DIAGNOSIS — J209 Acute bronchitis, unspecified: Secondary | ICD-10-CM

## 2017-06-28 DIAGNOSIS — R0602 Shortness of breath: Secondary | ICD-10-CM | POA: Diagnosis not present

## 2017-06-28 DIAGNOSIS — R05 Cough: Secondary | ICD-10-CM

## 2017-06-28 MED ORDER — AZITHROMYCIN 250 MG PO TABS: 250.0000 mg | ORAL_TABLET | Freq: Every day | ORAL | 0 refills | Status: DC

## 2017-06-28 MED ORDER — PREDNISONE 20 MG PO TABS: ORAL_TABLET | ORAL | 0 refills | Status: DC

## 2017-06-28 MED ORDER — ALBUTEROL SULFATE HFA 108 (90 BASE) MCG/ACT IN AERS
1.0000 | INHALATION_SPRAY | Freq: Four times a day (QID) | RESPIRATORY_TRACT | 0 refills | Status: DC | PRN
Start: 2017-06-28 — End: 2019-07-17

## 2017-06-28 MED ORDER — IPRATROPIUM-ALBUTEROL 0.5-2.5 (3) MG/3ML IN SOLN
3.0000 mL | Freq: Four times a day (QID) | RESPIRATORY_TRACT | Status: DC
  Administered 2017-06-28 (×3): 3 mL via RESPIRATORY_TRACT

## 2017-06-28 NOTE — Discharge Instructions (Signed)
See Dr. Suzi Roots if symptoms persist past one week

## 2017-06-28 NOTE — ED Triage Notes (Signed)
Pt c/o cough, wheezing and SOB x1 week. States she started prednisone last week.

## 2017-06-30 NOTE — ED Provider Notes (Signed)
[facility]    CSN: [id number] Arrival date & time: 06/28/17  1354     History   Chief Complaint Chief Complaint  Patient presents with  . Cough    HPI Shannon Carr is a 52 y.o. female.   The history is provided by the patient. No language interpreter was used.  Cough  Cough characteristics:  Productive Sputum characteristics:  Nondescript Severity:  Moderate Onset quality:  Gradual Duration:  1 week Timing:  Constant Progression:  Worsening Chronicity:  New Relieved by:  Nothing Worsened by:  Nothing Ineffective treatments: prednisone. Associated symptoms: shortness of breath and wheezing    Pt reports she took 40 mg of prednisone from old rx.  Pt reports continued wheezing Past Medical History:  Diagnosis Date  . Hx of adenomatous colonic polyps 02/27/2016  . Ovarian cyst     Patient Active Problem List   Diagnosis Date Noted  . LGSIL on Pap smear of cervix 12/14/2016  . Hx of adenomatous colonic polyps 02/27/2016  .   06/20/2014  . HSV (herpes simplex virus) anogenital infection 04/05/2012  . Stress incontinence, female 04/06/2011  . ANAL FISSURE 11/02/2008    Past Surgical History:  Procedure Laterality Date  . ANTERIOR AND POSTERIOR REPAIR  12/2012  . TONSILLECTOMY AND ADENOIDECTOMY      OB History    Gravida  4   Para  3   Term  3   Preterm      AB  1   Living        SAB  1   TAB      Ectopic      Multiple      Live Births               Home Medications    Prior to Admission medications   Medication Sig Start Date End Date Taking? Authorizing Provider  acyclovir (ZOVIRAX) 400 MG tablet TAKE TWO TABLETS BY MOUTH TWICE DAILY AS NEEDED FOR 5 DAYS 12/07/16   Hali Marry, MD  albuterol (PROVENTIL HFA;VENTOLIN HFA) 108 (90 Base) MCG/ACT inhaler Inhale 1-2 puffs into the lungs every 6 (six) hours as needed for wheezing or shortness of breath. 06/28/17   Fransico Meadow, PA-C  azithromycin (ZITHROMAX) 250 MG  tablet Take 1 tablet (250 mg total) by mouth daily. Take first 2 tablets together, then 1 every day until finished. 06/28/17   Fransico Meadow, PA-C  imiquimod Leroy Sea) 5 % cream  09/09/16   [provider]  predniSONE (DELTASONE) 20 MG tablet 3 tablets a day for 6 days 06/28/17   Fransico Meadow, PA-C    Family History Family History  Problem Relation Age of Onset  . Melanoma Father   . Colon cancer Neg Hx     Social History Social History   Tobacco Use  . Smoking status: Never Smoker  . Smokeless tobacco: Never Used  Substance Use Topics  . Alcohol use: Yes    Comment: weekly-social  . Drug use: No     Allergies   Patient has no known allergies.   Review of Systems Review of Systems  Respiratory: Positive for cough, shortness of breath and wheezing.   All other systems reviewed and are negative.    Physical Exam Triage Vital Signs ED Triage Vitals  Enc Vitals Group     BP 06/28/17 1427 (!) 144/91     Pulse Rate 06/28/17 1427 78     Resp --  Temp 06/28/17 1427 97.8 F (36.6 C)     Temp Source 06/28/17 1427 Oral     SpO2 06/28/17 1427 100 %     Weight 06/28/17 1428 140 lb (63.5 kg)     Height --      Head Circumference --      Peak Flow --      Pain Score 06/28/17 1428 0     Pain Loc --      Pain Edu? --      Excl. in [place]? --    No data found.  Updated Vital Signs BP (!) 144/91 (BP Location: Right Arm)   Pulse 78   Temp 97.8 F (36.6 C) (Oral)   Wt 140 lb (63.5 kg)   SpO2 100%   BMI 22.60 kg/m   Visual Acuity Right Eye Distance:   Left Eye Distance:   Bilateral Distance:    Right Eye Near:   Left Eye Near:    Bilateral Near:     Physical Exam  Constitutional: She is oriented to person, place, and time. She appears well-developed and well-nourished.  HENT:  Head: Normocephalic.  Eyes: EOM are normal.  Neck: Normal range of motion.  Pulmonary/Chest: Effort normal. She has wheezes.  rhonchi  Abdominal: She exhibits no  distension.  Musculoskeletal: Normal range of motion.  Neurological: She is alert and oriented to person, place, and time.  Psychiatric: She has a normal mood and affect.  Nursing note and vitals reviewed.    UC Treatments / Results  Labs (all labs ordered are listed, but only abnormal results are displayed) Labs Reviewed - No data to display  EKG None Radiology No results found.  Procedures Procedures (including critical care time)  Medications Ordered in UC Medications - No data to display   Initial Impression / Assessment and Plan / UC Course  I have reviewed the triage vital signs and the nursing notes.  Pertinent labs & imaging results that were available during my care of the patient were reviewed by me and considered in my medical decision making (see chart for details).     MDM  I will try pt on full course of prednisone,  Pt advised to use albuterol inhaler,  Zithromax.   Pt advised if symptoms persist see Dr.Metheney for recheck.   Final Clinical Impressions(s) / UC Diagnoses   Final diagnoses:  Acute bronchitis, unspecified organism  Moderate persistent asthma with exacerbation    ED Discharge Orders        Ordered    azithromycin (ZITHROMAX) 250 MG tablet  Daily     06/28/17 1518    albuterol (PROVENTIL HFA;VENTOLIN HFA) 108 (90 Base) MCG/ACT inhaler  Every 6 hours PRN     06/28/17 1518    predniSONE (DELTASONE) 20 MG tablet     06/28/17 1518       Controlled Substance Prescriptions [place] Controlled Substance Registry consulted? Not Applicable   Fransico Meadow, [place] 06/30/17 [id number]

## 2017-10-08 ENCOUNTER — Other Ambulatory Visit: Payer: Self-pay | Admitting: *Deleted

## 2017-10-08 DIAGNOSIS — A609 Anogenital herpesviral infection, unspecified: Secondary | ICD-10-CM

## 2017-10-08 MED ORDER — ACYCLOVIR 400 MG PO TABS: ORAL_TABLET | ORAL | 0 refills | Status: DC

## 2017-11-30 ENCOUNTER — Other Ambulatory Visit (HOSPITAL_COMMUNITY)
Admission: RE | Admit: 2017-11-30 | Discharge: 2017-11-30 | Disposition: A | Discharge status: Home / Self Care | Payer: BLUE CROSS/BLUE SHIELD | Source: Ambulatory Visit | Attending: Family Medicine | Admitting: Family Medicine

## 2017-11-30 ENCOUNTER — Encounter: Payer: Self-pay | Admitting: Family Medicine

## 2017-11-30 ENCOUNTER — Ambulatory Visit (INDEPENDENT_AMBULATORY_CARE_PROVIDER_SITE_OTHER): Payer: BLUE CROSS/BLUE SHIELD | Admitting: Family Medicine

## 2017-11-30 VITALS — BP 126/60 | HR 85 | Ht 66.0 in | Wt 140.0 lb

## 2017-11-30 DIAGNOSIS — N926 Irregular menstruation, unspecified: Secondary | ICD-10-CM | POA: Diagnosis not present

## 2017-11-30 DIAGNOSIS — Z1159 Encounter for screening for other viral diseases: Secondary | ICD-10-CM | POA: Diagnosis not present

## 2017-11-30 DIAGNOSIS — L659 Nonscarring hair loss, unspecified: Secondary | ICD-10-CM | POA: Diagnosis not present

## 2017-11-30 DIAGNOSIS — Z0000 Encounter for general adult medical examination without abnormal findings: Secondary | ICD-10-CM

## 2017-11-30 DIAGNOSIS — R195 Other fecal abnormalities: Secondary | ICD-10-CM

## 2017-11-30 DIAGNOSIS — Z124 Encounter for screening for malignant neoplasm of cervix: Secondary | ICD-10-CM | POA: Insufficient documentation

## 2017-11-30 NOTE — Patient Instructions (Signed)

## 2017-11-30 NOTE — Progress Notes (Signed)
Subjective:     Shannon Carr is a 52 y.o. female and is here for a comprehensive physical exam. The patient reports problems - periods have been irregular.  She wonders if she be could be going into perimenopause.  Her last period was at the beginning of August but lasted 18 days.  She is also been noticing some hair loss.  She would like to have her thyroid checked.  As well as some female hormone levels.  Social History   Socioeconomic History  . Marital status: Married    Spouse name: Not on file  . Number of children: 3  . Years of education: Not on file  . Highest education level: Professional school degree (e.g., MD, DDS, DVM, JD)  Occupational History  . Occupation: Designer, jewellery  Social Needs  . Financial resource strain: Not hard at all  . Food insecurity:    Worry: Not on file    Inability: Not on file  . Transportation needs:    Medical: Not on file    Non-medical: Not on file  Tobacco Use  . Smoking status: Never Smoker  . Smokeless tobacco: Never Used  Substance and Sexual Activity  . Alcohol use: Yes    Comment: weekly-social  . Drug use: No  . Sexual activity: Yes    Partners: Male    Birth control/protection: Surgical    Comment: Vasectomy  Lifestyle  . Physical activity:    Days per week: Not on file    Minutes per session: Not on file  . Stress: Not on file  Relationships  . Social connections:    Talks on phone: Not on file    Gets together: Not on file    Attends religious service: Not on file    Active member of club or organization: Not on file    Attends meetings of clubs or organizations: Not on file    Relationship status: Not on file  . Intimate partner violence:    Fear of current or ex partner: Not on file    Emotionally abused: Not on file    Physically abused: Not on file    Forced sexual activity: Not on file  Other Topics Concern  . 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 Works out regularly.     Health Maintenance  Topic Date Due  . INFLUENZA VACCINE  01/20/2018 (Originally 10/21/2017)  . MAMMOGRAM  04/09/2018  . COLONOSCOPY  02/24/2019  . PAP SMEAR  10/28/2019  . TETANUS/TDAP  09/15/2026  . HIV Screening  Completed    The following portions of the patient's history were reviewed and updated as appropriate: allergies, current medications, past family history, past medical history, past social history, past surgical history and problem list.  Review of Systems A comprehensive review of systems was negative.   Objective:    BP 126/60   Pulse 85   Ht 5\' 6"  (1.676 m)   Wt 140 lb (63.5 kg)   LMP 10/21/2017   SpO2 100%   BMI 22.60 kg/m  General appearance: alert, cooperative and appears stated age Head: Normocephalic, without obvious abnormality, atraumatic Eyes: conj clear, EOMI, PEERLA Ears: normal TM's and external ear canals both ears Nose: Nares normal. Septum midline. Mucosa normal. No drainage or sinus tenderness. Throat: lips, mucosa, and tongue normal; teeth and gums normal Neck: no adenopathy, no carotid bruit, no JVD, supple, symmetrical, trachea midline and thyroid not enlarged, symmetric, no tenderness/mass/nodules Back:  symmetric, no curvature. ROM normal. No CVA tenderness. Lungs: clear to auscultation bilaterally Breasts: normal appearance, no masses or tenderness Heart: regular rate and rhythm, S1, S2 normal, no murmur, click, rub or gallop Abdomen: soft, non-tender; bowel sounds normal; no masses,  no organomegaly Pelvic: cervix normal in appearance, external genitalia normal, no adnexal masses or tenderness, no cervical motion tenderness, rectovaginal septum normal, uterus normal size, shape, and consistency and vagina normal without discharge Extremities: extremities normal, atraumatic, no cyanosis or edema Pulses: 2+ and symmetric Skin: Skin color, texture, turgor normal. No rashes or lesions Lymph nodes: Cervical, supraclavicular, and axillary nodes  normal. Neurologic: Alert and oriented X 3, normal strength and tone. Normal symmetric reflexes. Normal coordination and gait    Assessment:    Healthy female exam.      Plan:     See After Visit Summary for Counseling Recommendations   Keep up a regular exercise program and make sure you are eating a healthy diet Try to eat 4 servings of dairy a day, or if you are lactose intolerant take a calcium with vitamin D daily.  Your vaccines are up to date.   Last colonoscopy was December 2017 she is due for a 3-year recall which would be in 2020 she is been having some change in her stools and wonders if she could actually get that done a little early.  Irregular periods -check female hormone levels.  It could be that she is perimenopausal.  Reports that her mammogram is up-to-date and she gets that at [facility].  Change in stools-see note above we will go ahead and refer to GI to see if they may want to do her colonoscopy a little early.

## 2017-12-01 LAB — COMPLETE METABOLIC PANEL WITH GFR
AG RATIO: 1.9 (calc) (ref 1.0–2.5)
ALT: 17 U/L (ref 6–29)
AST: 22 U/L (ref 10–35)
Albumin: 4.6 g/dL (ref 3.6–5.1)
Alkaline phosphatase (APISO): 51 U/L (ref 33–130)
BILIRUBIN TOTAL: 0.9 mg/dL (ref 0.2–1.2)
BUN: 14 mg/dL (ref 7–25)
CHLORIDE: 103 mmol/L (ref 98–110)
CO2: 26 mmol/L (ref 20–32)
Calcium: 9.5 mg/dL (ref 8.6–10.4)
Creat: 0.87 mg/dL (ref 0.50–1.05)
GFR, EST AFRICAN AMERICAN: 89 mL/min/{1.73_m2} (ref >=60)
GFR, Est Non African American: 77 mL/min/{1.73_m2} (ref >=60)
Globulin: 2.4 g/dL (calc) (ref 1.9–3.7)
Glucose, Bld: 94 mg/dL (ref 65–99)
POTASSIUM: 4 mmol/L (ref 3.5–5.3)
Sodium: 137 mmol/L (ref 135–146)
Total Protein: 7 g/dL (ref 6.1–8.1)

## 2017-12-01 LAB — CBC
HEMATOCRIT: 41.6 % (ref 35.0–45.0)
HEMOGLOBIN: 14 g/dL (ref 11.7–15.5)
MCH: 30.7 pg (ref 27.0–33.0)
MCHC: 33.7 g/dL (ref 32.0–36.0)
MCV: 91.2 fL (ref 80.0–100.0)
MPV: 11.9 fL (ref 7.5–12.5)
Platelets: 207 10*3/uL (ref 140–400)
RBC: 4.56 10*6/uL (ref 3.80–5.10)
RDW: 12.7 % (ref 11.0–15.0)
WBC: 5.7 10*3/uL (ref 3.8–10.8)

## 2017-12-01 LAB — LIPID PANEL
Cholesterol: 217 mg/dL — ABNORMAL HIGH (ref <200)
HDL: 100 mg/dL (ref >50)
LDL Cholesterol (Calc): 102 mg/dL (calc) — ABNORMAL HIGH
Non-HDL Cholesterol (Calc): 117 mg/dL (calc) (ref <130)
TRIGLYCERIDES: 66 mg/dL (ref <150)
Total CHOL/HDL Ratio: 2.2 (calc) (ref <5.0)

## 2017-12-01 LAB — PROGESTERONE: Progesterone: 2 ng/mL

## 2017-12-01 LAB — HEPATITIS C ANTIBODY
HEP C AB: NONREACTIVE
SIGNAL TO CUT-OFF: 0.01 (ref <1.00)

## 2017-12-01 LAB — TSH: TSH: 1.48 mIU/L

## 2017-12-01 LAB — FOLLICLE STIMULATING HORMONE: FSH: 15.2 m[IU]/mL

## 2017-12-01 LAB — LUTEINIZING HORMONE: LH: 23.7 m[IU]/mL

## 2017-12-01 LAB — ESTRADIOL: ESTRADIOL: 320 pg/mL

## 2017-12-02 ENCOUNTER — Encounter: Payer: Self-pay | Admitting: Family Medicine

## 2017-12-02 DIAGNOSIS — L659 Nonscarring hair loss, unspecified: Secondary | ICD-10-CM

## 2017-12-02 LAB — CYTOLOGY - PAP
Adequacy: ABSENT
Diagnosis: NEGATIVE
HPV (WINDOPATH): NOT DETECTED

## 2017-12-09 ENCOUNTER — Encounter: Payer: Self-pay | Admitting: Family Medicine

## 2017-12-16 ENCOUNTER — Other Ambulatory Visit: Payer: Self-pay | Admitting: Family Medicine

## 2017-12-16 DIAGNOSIS — A609 Anogenital herpesviral infection, unspecified: Secondary | ICD-10-CM

## 2018-02-09 ENCOUNTER — Encounter: Payer: Self-pay | Admitting: Internal Medicine

## 2018-02-09 ENCOUNTER — Ambulatory Visit (INDEPENDENT_AMBULATORY_CARE_PROVIDER_SITE_OTHER): Payer: BLUE CROSS/BLUE SHIELD | Admitting: Internal Medicine

## 2018-02-09 VITALS — BP 120/70 | HR 68 | Ht 66.0 in

## 2018-02-09 DIAGNOSIS — Z86010 Personal history of colonic polyps: Secondary | ICD-10-CM | POA: Diagnosis not present

## 2018-02-09 DIAGNOSIS — Z860101 Personal history of adenomatous and serrated colon polyps: Secondary | ICD-10-CM

## 2018-02-09 DIAGNOSIS — K921 Melena: Secondary | ICD-10-CM | POA: Diagnosis not present

## 2018-02-09 NOTE — Progress Notes (Signed)
   Shannon Carr 52 y.o. [birth year] [id number]  Assessment & Plan:   Encounter Diagnoses  Name Primary?  . Hematochezia Yes  . Hx of adenomatous colonic polyps     We will go ahead and evaluate with colonoscopy now.  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  However she did have 6 adenomas 2 years ago and is at high risk of colon cancer and there is some family history issues and her father had melanoma at about 69 when he died so there are some links to melanoma at times and colon cancer.  The risks and benefits as well as alternatives of endoscopic procedure(s) have been discussed and reviewed. All questions answered. The patient agrees to proceed.  I appreciate the opportunity to care for this patient. CC: Shannon Marry, MD   Subjective:   Chief Complaint: Hematochezia  HPI Shannon Carr is here saying that a few months ago she had several episodes of bloody diarrhea when she was increasing the amount she was running.  No significant abdominal pain.  Has not had it in a while but since she had 6 adenomas 2 years ago and she also talks about a maternal aunt having colon cancer diagnosed in her 45s and a maternal uncle also having colon cancer she wonders if she should have a colonoscopy now instead of waiting till her routine timing next year.  Otherwise she feels well. No Known Allergies Current Meds  Medication Sig  . acyclovir (ZOVIRAX) 400 MG tablet TAKE TWO TABLETS BY MOUTH TWICE DAILY AS NEEDED FOR 5 DAYS  . albuterol (PROVENTIL HFA;VENTOLIN HFA) 108 (90 Base) MCG/ACT inhaler Inhale 1-2 puffs into the lungs every 6 (six) hours as needed for wheezing or shortness of breath.   Past Medical History:  Diagnosis Date  . Hx of adenomatous colonic polyps 02/27/2016  . Ovarian cyst    Past Surgical History:  Procedure Laterality Date  . ANTERIOR AND POSTERIOR REPAIR  12/2012  . COLONOSCOPY  2017  . 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 Works out regularly.    Never smoker, no drug use some alcohol   family history includes Colon cancer in her other; Colon cancer (age of onset: 75) in her maternal aunt; Melanoma in her father.   Review of Systems As above  Objective:   Physical Exam BP 120/70   Pulse 68   Ht 5\' 6"  (1.676 m)   LMP 02/02/2018 (Approximate)   BMI 22.60 kg/m  No acute distress  15 minutes time spent with patient > half in counseling coordination of care

## 2018-02-09 NOTE — Patient Instructions (Signed)
You have been scheduled for a colonoscopy. Please follow written instructions given to you at your visit today.  Please pick up your prep supplies at the pharmacy within the next 1-3 days. If you use inhalers (even only as needed), please bring them with you on the day of your procedure.   I appreciate the opportunity to care for you. Carl Gessner, MD, FACG 

## 2018-03-02 ENCOUNTER — Encounter: Payer: Self-pay | Admitting: Internal Medicine

## 2018-03-02 ENCOUNTER — Ambulatory Visit (AMBULATORY_SURGERY_CENTER): Payer: BLUE CROSS/BLUE SHIELD | Admitting: Internal Medicine

## 2018-03-02 VITALS — BP 112/66 | HR 60 | Temp 98.6°F | Resp 12 | Ht 66.0 in | Wt 139.0 lb

## 2018-03-02 DIAGNOSIS — K635 Polyp of colon: Secondary | ICD-10-CM

## 2018-03-02 DIAGNOSIS — D123 Benign neoplasm of transverse colon: Secondary | ICD-10-CM | POA: Diagnosis not present

## 2018-03-02 DIAGNOSIS — K921 Melena: Secondary | ICD-10-CM

## 2018-03-02 DIAGNOSIS — Z86010 Personal history of colonic polyps: Secondary | ICD-10-CM | POA: Diagnosis not present

## 2018-03-02 MED ORDER — SODIUM CHLORIDE 0.9 % IV SOLN: 500.0000 mL | Freq: Once | INTRAVENOUS | Status: DC

## 2018-03-02 NOTE — Progress Notes (Signed)
Alert and oriented x 3, pleased with MAC, report to RN

## 2018-03-02 NOTE — Patient Instructions (Addendum)
I found and removed one 2-3 mm polyp. All else ok.  I anticipate recommending a repeat colonoscopy in 5 years.  I will let you know pathology results and when to have another routine colonoscopy by mail and/or My Chart.  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  I appreciate the opportunity to care for you. Gatha Mayer, MD, FACG     YOU HAD AN ENDOSCOPIC PROCEDURE TODAY AT [facility]:   Refer to the procedure report that was given to you for any specific questions about what was found during the examination.  If the procedure report does not answer your questions, please call your gastroenterologist to clarify.  If you requested that your care partner not be given the details of your procedure findings, then the procedure report has been included in a sealed envelope for you to review at your convenience later.  YOU SHOULD EXPECT: Some feelings of bloating in the abdomen. Passage of more gas than usual.  Walking can help get rid of the air that was put into your GI tract during the procedure and reduce the bloating. If you had a lower endoscopy (such as a colonoscopy or flexible sigmoidoscopy) you may notice spotting of blood in your stool or on the toilet paper. If you underwent a bowel prep for your procedure, you may not have a normal bowel movement for a few days.  Please Note:  You might notice some irritation and congestion in your nose or some drainage.  This is from the oxygen used during your procedure.  There is no need for concern and it should clear up in a day or so.  SYMPTOMS TO REPORT IMMEDIATELY:   Following lower endoscopy (colonoscopy or flexible sigmoidoscopy):  Excessive amounts of blood in the stool  Significant tenderness or worsening of abdominal pains  Swelling of the abdomen that is new, acute  Fever of 100F or higher   For urgent or emergent issues, a gastroenterologist can be reached at any hour by calling [phone]  [phone].   DIET:  We do recommend a small meal at first, but then you may proceed to your regular diet.  Drink plenty of fluids but you should avoid alcoholic beverages for 24 hours.  ACTIVITY:  You should plan to take it easy for the rest of today and you should NOT DRIVE or use heavy machinery until tomorrow (because of the sedation medicines used during the test).    FOLLOW UP: Our staff will call the number listed on your records the next business day following your procedure to check on you and address any questions or concerns that you may have regarding the information given to you following your procedure. If we do not reach you, we will leave a message.  However, if you are feeling well and you are not experiencing any problems, there is no need to return our call.  We will assume that you have returned to your regular daily activities without incident.  If any biopsies were taken you will be contacted by phone or by letter within the next 1-3 weeks.  Please call us at [phone] if you have not heard about the biopsies in 3 weeks.    SIGNATURES/CONFIDENTIALITY: You and/or your care partner have signed paperwork which will be entered into your electronic medical record.  These signatures attest to the fact that that the information above on your After Visit Summary has been reviewed and is understood.  Full  responsibility of the confidentiality of this discharge information lies with you and/or your care-partner.    Handout was given to your care partner on polyps. You may resume your current medications today. Await biopsy results. Please call if any questions or concerns.

## 2018-03-02 NOTE — Op Note (Signed)
[facility] Patient Name: Shannon Carr Procedure Date: 03/02/2018 4:32 PM MRN: [id number] Endoscopist: Gatha Mayer , MD Age: 52 Referring MD:  Date of Birth: Nov 28, 1965 Gender: Female Account #: [id number] Procedure:                Colonoscopy Indications:              Hematochezia Medicines:                Propofol per Anesthesia, Monitored Anesthesia Care Procedure:                Pre-Anesthesia Assessment:                           - Prior to the procedure, a History and Physical                            was performed, and patient medications and                            allergies were reviewed. The patient's tolerance of                            previous anesthesia was also reviewed. The risks                            and benefits of the procedure and the sedation                            options and risks were discussed with the patient.                            All questions were answered, and informed consent                            was obtained. Prior Anticoagulants: The patient has                            taken no previous anticoagulant or antiplatelet                            agents. ASA Grade Assessment: I - A normal, healthy                            patient. After reviewing the risks and benefits,                            the patient was deemed in satisfactory condition to                            undergo the procedure.                           After obtaining informed consent, the colonoscope  was passed under direct vision. Throughout the                            procedure, the patient's blood pressure, pulse, and                            oxygen saturations were monitored continuously. The                            Colonoscope was introduced through the anus and                            advanced to the the cecum, identified by                            appendiceal orifice and ileocecal valve. The                             colonoscopy was performed without difficulty. The                            patient tolerated the procedure well. The quality                            of the bowel preparation was excellent. The bowel                            preparation used was Miralax. The ileocecal valve,                            appendiceal orifice, and rectum were photographed. Scope In: 4:46:05 PM Scope Out: 5:05:10 PM Scope Withdrawal Time: 0 hours 14 minutes 0 seconds  Total Procedure Duration: 0 hours 19 minutes 5 seconds  Findings:                 The perianal and digital rectal examinations were                            normal.                           A 2 to 3 mm polyp was found in the distal                            transverse colon. The polyp was sessile. The polyp                            was removed with a cold snare. Resection and                            retrieval were complete. Verification of patient                            identification for the specimen was done. Estimated  blood loss was minimal.                           The exam was otherwise without abnormality on                            direct and retroflexion views. Complications:            No immediate complications. Estimated Blood Loss:     Estimated blood loss was minimal. Impression:               - One 2 to 3 mm polyp in the distal transverse                            colon, removed with a cold snare. Resected and                            retrieved.                           - The examination was otherwise normal on direct                            and retroflexion views.                           - Personal history of colonic polyps. 6 adenomas 2                            years ago.                           Maternal aunt and maternal great uncle had colon                            cancer Recommendation:           - Patient has a contact number  available for                            emergencies. The signs and symptoms of potential                            delayed complications were discussed with the                            patient. Return to normal activities tomorrow.                            Written discharge instructions were provided to the                            patient.                           - Resume previous diet.                           -  Continue present medications.                           - Repeat colonoscopy is recommended for                            surveillance. The colonoscopy date will be                            determined after pathology results from today's                            exam become available for review. Gatha Mayer, MD 03/02/2018 5:15:50 PM This report has been signed electronically.

## 2018-03-02 NOTE — Progress Notes (Signed)
No problems noted in the recovery room. maw 

## 2018-03-03 ENCOUNTER — Telehealth: Payer: Self-pay | Admitting: *Deleted

## 2018-03-03 ENCOUNTER — Telehealth: Payer: Self-pay

## 2018-03-03 NOTE — Telephone Encounter (Signed)
No answer for post procedure call back. Left message and will attempt to call the pt back this afternoon. SM

## 2018-03-03 NOTE — Telephone Encounter (Signed)
Pt is returning your call and said she is doing good

## 2018-03-03 NOTE — Telephone Encounter (Signed)
  Follow up Call-  Call back number 03/02/2018 02/24/2016  Post procedure Call Back phone  # [phone]  Permission to leave phone message Yes Yes  Some recent data might be hidden     Patient questions:  Do you have a fever, pain , or abdominal swelling? No. Pain Score  0 *  Have you tolerated food without any problems? Yes.    Have you been able to return to your normal activities? Yes.    Do you have any questions about your discharge instructions: Diet   No. Medications  No. Follow up visit  No.  Do you have questions or concerns about your Care? No.  Actions: * If pain score is 4 or above: No action needed, pain <4.

## 2018-03-10 ENCOUNTER — Encounter: Payer: Self-pay | Admitting: Internal Medicine

## 2018-03-10 NOTE — Progress Notes (Signed)
Diminutive ssp Recall 2024-5 (5 years)  My Chart

## 2018-04-27 DIAGNOSIS — Z23 Encounter for immunization: Secondary | ICD-10-CM | POA: Diagnosis not present

## 2018-04-27 DIAGNOSIS — L821 Other seborrheic keratosis: Secondary | ICD-10-CM | POA: Diagnosis not present

## 2018-04-27 DIAGNOSIS — L814 Other melanin hyperpigmentation: Secondary | ICD-10-CM | POA: Diagnosis not present

## 2018-04-27 DIAGNOSIS — L570 Actinic keratosis: Secondary | ICD-10-CM | POA: Diagnosis not present

## 2018-04-27 DIAGNOSIS — Z808 Family history of malignant neoplasm of other organs or systems: Secondary | ICD-10-CM | POA: Diagnosis not present

## 2018-04-27 DIAGNOSIS — D225 Melanocytic nevi of trunk: Secondary | ICD-10-CM | POA: Diagnosis not present

## 2018-05-05 DIAGNOSIS — Z1231 Encounter for screening mammogram for malignant neoplasm of breast: Secondary | ICD-10-CM | POA: Diagnosis not present

## 2018-05-05 LAB — HM MAMMOGRAPHY

## 2018-05-12 ENCOUNTER — Encounter: Payer: Self-pay | Admitting: Family Medicine

## 2018-05-18 DIAGNOSIS — R922 Inconclusive mammogram: Secondary | ICD-10-CM | POA: Diagnosis not present

## 2018-05-18 DIAGNOSIS — R928 Other abnormal and inconclusive findings on diagnostic imaging of breast: Secondary | ICD-10-CM | POA: Diagnosis not present

## 2018-05-18 DIAGNOSIS — N6489 Other specified disorders of breast: Secondary | ICD-10-CM | POA: Diagnosis not present

## 2018-08-22 ENCOUNTER — Emergency Department (INDEPENDENT_AMBULATORY_CARE_PROVIDER_SITE_OTHER)
Admission: EM | Admit: 2018-08-22 | Discharge: 2018-08-22 | Disposition: A | Discharge status: Home / Self Care | Payer: BLUE CROSS/BLUE SHIELD | Source: Home / Self Care

## 2018-08-22 ENCOUNTER — Encounter: Payer: Self-pay | Admitting: Emergency Medicine

## 2018-08-22 ENCOUNTER — Other Ambulatory Visit: Payer: Self-pay

## 2018-08-22 DIAGNOSIS — S61210A Laceration without foreign body of right index finger without damage to nail, initial encounter: Secondary | ICD-10-CM

## 2018-08-22 DIAGNOSIS — S65511A Laceration of blood vessel of left index finger, initial encounter: Secondary | ICD-10-CM

## 2018-08-22 NOTE — ED Triage Notes (Signed)
Cut the pad of her right index finger off with a kitchen knife

## 2018-08-22 NOTE — ED Provider Notes (Signed)
[facility]    CSN: [id number] Arrival date & time: 08/22/18  1312     History   Chief Complaint Chief Complaint  Patient presents with  . Laceration    HPI Shannon Carr is a 53 y.o. female.   The history is provided by the patient. No language interpreter was used.  Laceration  Location:  Finger Finger laceration location:  R index finger Length:  1 Depth:  Through dermis Quality: avulsion   Bleeding: uncontrolled   Pain details:    Quality:  Aching   Severity:  No pain Foreign body present:  No foreign bodies Relieved by:  Nothing Worsened by:  Nothing Tetanus status:  Up to date Associated symptoms: fever     Past Medical History:  Diagnosis Date  . Hx of adenomatous colonic polyps 02/27/2016  . Ovarian cyst     Patient Active Problem List   Diagnosis Date Noted  . LGSIL on Pap smear of cervix 12/14/2016  . Hx of adenomatous colonic polyps 02/27/2016  .   06/20/2014  . HSV (herpes simplex virus) anogenital infection 04/05/2012  . Stress incontinence, female 04/06/2011  . ANAL FISSURE 11/02/2008    Past Surgical History:  Procedure Laterality Date  . ANTERIOR AND POSTERIOR REPAIR  12/2012  . COLONOSCOPY  2017  . TONSILLECTOMY AND ADENOIDECTOMY      OB History    Gravida  4   Para  3   Term  3   Preterm      AB  1   Living        SAB  1   TAB      Ectopic      Multiple      Live Births               Home Medications    Prior to Admission medications   Medication Sig Start Date End Date Taking? Authorizing Provider  acyclovir (ZOVIRAX) 400 MG tablet TAKE TWO TABLETS BY MOUTH TWICE DAILY AS NEEDED FOR 5 DAYS 12/16/17   Hali Marry, MD  albuterol (PROVENTIL HFA;VENTOLIN HFA) 108 (90 Base) MCG/ACT inhaler Inhale 1-2 puffs into the lungs every 6 (six) hours as needed for wheezing or shortness of breath. 06/28/17   Fransico Meadow, PA-C    Family History Family History  Problem Relation Age of Onset  .  Melanoma Father   . Colon cancer Maternal Aunt 72  . Colon cancer Other        Elderly    Social History Social History   Tobacco Use  . Smoking status: Never Smoker  . Smokeless tobacco: Never Used  Substance Use Topics  . Alcohol use: Yes    Comment: weekly-social  . Drug use: No     Allergies   Patient has no known allergies.   Review of Systems Review of Systems  Constitutional: Positive for fever.  All other systems reviewed and are negative.    Physical Exam Triage Vital Signs ED Triage Vitals  Enc Vitals Group     BP 08/22/18 1437 (!) 163/82     Pulse Rate 08/22/18 1437 75     Resp --      Temp 08/22/18 1437 97.9 F (36.6 C)     Temp Source 08/22/18 1437 Oral     SpO2 08/22/18 1437 99 %     Weight 08/22/18 1438 142 lb (64.4 kg)     Height 08/22/18 1438 5\' 7"  (1.702 m)  Head Circumference --      Peak Flow --      Pain Score 08/22/18 1437 3     Pain Loc --      Pain Edu? --      Excl. in [place]? --    No data found.  Updated Vital Signs BP (!) 163/82 (BP Location: Right Arm)   Pulse 75   Temp 97.9 F (36.6 C) (Oral)   Ht 5\' 7"  (1.702 m)   Wt 64.4 kg   SpO2 99%   BMI 22.24 kg/m   Visual Acuity Right Eye Distance:   Left Eye Distance:   Bilateral Distance:    Right Eye Near:   Left Eye Near:    Bilateral Near:     Physical Exam Vitals signs reviewed.  HENT:     Head: Normocephalic.  Musculoskeletal:        General: Signs of injury present.     Comments: Laceration distal tip right index finger,   Skin:    General: Skin is warm.  Neurological:     General: No focal deficit present.     Mental Status: She is alert.  Psychiatric:        Mood and Affect: Mood normal.      UC Treatments / Results  Labs (all labs ordered are listed, but only abnormal results are displayed) Labs Reviewed - No data to display  EKG None  Radiology No results found.  Procedures Procedures (including critical care time)  Medications  Ordered in UC Medications - No data to display  Initial Impression / Assessment and Plan / UC Course  I have reviewed the triage vital signs and the nursing notes.  Pertinent labs & imaging results that were available during my care of the patient were reviewed by me and considered in my medical decision making (see chart for details).     MDM   Thrombi clot applied to wound.  Pt observed.  Bleeding controlled.   Pt counseled on wound care  Final Clinical Impressions(s) / UC Diagnoses   Final diagnoses:  Laceration of blood vessel of left index finger, initial encounter     Discharge Instructions     TDAP 2018    ED Prescriptions    None     Controlled Substance Prescriptions [place] Controlled Substance Registry consulted? Not Applicable   Fransico Meadow, [place] 08/22/18 1508

## 2018-08-22 NOTE — Discharge Instructions (Signed)
TDAP 2018

## 2018-11-21 DIAGNOSIS — L658 Other specified nonscarring hair loss: Secondary | ICD-10-CM | POA: Diagnosis not present

## 2019-05-02 DIAGNOSIS — D225 Melanocytic nevi of trunk: Secondary | ICD-10-CM | POA: Diagnosis not present

## 2019-05-02 DIAGNOSIS — L570 Actinic keratosis: Secondary | ICD-10-CM | POA: Diagnosis not present

## 2019-05-02 DIAGNOSIS — Z808 Family history of malignant neoplasm of other organs or systems: Secondary | ICD-10-CM | POA: Diagnosis not present

## 2019-05-02 DIAGNOSIS — L814 Other melanin hyperpigmentation: Secondary | ICD-10-CM | POA: Diagnosis not present

## 2019-05-02 DIAGNOSIS — L821 Other seborrheic keratosis: Secondary | ICD-10-CM | POA: Diagnosis not present

## 2019-05-09 DIAGNOSIS — Z1231 Encounter for screening mammogram for malignant neoplasm of breast: Secondary | ICD-10-CM | POA: Diagnosis not present

## 2019-05-09 DIAGNOSIS — Z1239 Encounter for other screening for malignant neoplasm of breast: Secondary | ICD-10-CM | POA: Diagnosis not present

## 2019-05-09 LAB — HM MAMMOGRAPHY

## 2019-06-01 ENCOUNTER — Encounter: Payer: Self-pay | Admitting: Family Medicine

## 2019-07-14 ENCOUNTER — Other Ambulatory Visit: Payer: Self-pay | Admitting: Family Medicine

## 2019-07-14 DIAGNOSIS — A609 Anogenital herpesviral infection, unspecified: Secondary | ICD-10-CM

## 2019-07-17 ENCOUNTER — Encounter: Payer: Self-pay | Admitting: Family Medicine

## 2019-07-17 ENCOUNTER — Ambulatory Visit (INDEPENDENT_AMBULATORY_CARE_PROVIDER_SITE_OTHER): Payer: BC Managed Care – PPO | Admitting: Family Medicine

## 2019-07-17 ENCOUNTER — Encounter: Payer: BLUE CROSS/BLUE SHIELD | Admitting: Family Medicine

## 2019-07-17 VITALS — BP 133/61 | HR 67 | Ht 66.0 in | Wt 131.0 lb

## 2019-07-17 DIAGNOSIS — Z0000 Encounter for general adult medical examination without abnormal findings: Secondary | ICD-10-CM

## 2019-07-17 DIAGNOSIS — A609 Anogenital herpesviral infection, unspecified: Secondary | ICD-10-CM

## 2019-07-17 MED ORDER — ACYCLOVIR 400 MG PO TABS: 400.0000 mg | ORAL_TABLET | Freq: Every day | ORAL | 1 refills | Status: DC | PRN

## 2019-07-17 MED ORDER — ALBUTEROL SULFATE HFA 108 (90 BASE) MCG/ACT IN AERS: 1.0000 | INHALATION_SPRAY | Freq: Four times a day (QID) | RESPIRATORY_TRACT | 99 refills | Status: DC | PRN

## 2019-07-17 NOTE — Progress Notes (Signed)
Established Patient Office Visit  Subjective:  Patient ID: Shannon Carr, female    DOB: [birth year]  Age: 54 y.o. MRN: [id number]  CC:  Chief Complaint  Patient presents with  . Annual Exam    HPI Shannon Carr presents for CPE.  She is doing well overall.  She is getting paid Perdiem currently.  She plays tennis about 5 days a week and stays pretty active.  She said she had gained a lot of weight until recently when she decided to get the weight back off and she is actually lost 21 pounds.  She reports her home blood pressures have mostly been running in the 120s but she is noticed lately that when she goes to her doctor's office it always seems a little bit higher.  She did complete her shingles vaccination series at [facility].  He has had not had the Covid vaccine yet.  Let me know about a couple of things she has been having some tightness particularly in her trapezius muscle she thinks is from playing tennis so has been getting a massage here there.  But also recently noted that she was getting some grinding in her right jaw.  She says it is really not painful but she is just noticed that it feels really tight and she can hear a grinding noise when she opens and closes her mouth.  Pap smear is up-to-date.    Past Medical History:  Diagnosis Date  . Hx of adenomatous colonic polyps 02/27/2016  . Ovarian cyst     Past Surgical History:  Procedure Laterality Date  . ANTERIOR AND POSTERIOR REPAIR  12/2012  . COLONOSCOPY  2017  . TONSILLECTOMY AND ADENOIDECTOMY      Family History  Problem Relation Age of Onset  . Melanoma Father   . Colon cancer Maternal Aunt 72  . Colon cancer Other        Elderly    Social History   Socioeconomic History  . Marital status: Married    Spouse name: Not on file  . Number of children: 3  . Years of education: Not on file  . Highest education level: Professional school degree (e.g., MD, DDS, DVM, JD)  Occupational History  . Occupation:  FNP     Employer: [employer]   Tobacco Use  . Smoking status: Never Smoker  . Smokeless tobacco: Never Used  Substance and Sexual Activity  . Alcohol use: Yes    Comment: weekly-social  . Drug use: No  . Sexual activity: Yes    Partners: Male    Birth control/protection: Surgical    Comment: Vasectomy  Other Topics Concern  . Not on file  Social History Narrative   Married, 3 children    FNP [employer]   Work in [employer]. Works out regularly.    Never smoker, no drug use some alcohol   Social Determinants of Health   Financial Resource Strain:   . Difficulty of Paying Living Expenses:   Food Insecurity:   . Worried About Charity fundraiser in the Last Year:   . Arboriculturist in the Last Year:   Transportation Needs:   . Film/video editor (Medical):   Marland Kitchen Lack of Transportation (Non-Medical):   Physical Activity:   . Days of Exercise per Week:   . Minutes of Exercise per Session:   Stress:   . Feeling of Stress :   Social Connections:   . Frequency of Communication with Friends and  Family:   . Frequency of Social Gatherings with Friends and Family:   . Attends Religious Services:   . Active Member of Clubs or Organizations:   . Attends Archivist Meetings:   Marland Kitchen Marital Status:   Intimate Partner Violence:   . Fear of Current or Ex-Partner:   . Emotionally Abused:   Marland Kitchen Physically Abused:   . Sexually Abused:     Outpatient Medications Prior to Visit  Medication Sig Dispense Refill  . acyclovir (ZOVIRAX) 400 MG tablet TAKE TWO TABLETS BY MOUTH TWICE DAILY AS NEEDED FOR 5 DAYS 30 tablet 0  . albuterol (PROVENTIL HFA;VENTOLIN HFA) 108 (90 Base) MCG/ACT inhaler Inhale 1-2 puffs into the lungs every 6 (six) hours as needed for wheezing or shortness of breath. 1 Inhaler 0  . 0.9 %  sodium chloride infusion      No facility-administered medications prior to visit.    No Known Allergies  ROS Review of Systems    Objective:    Physical  Exam  Constitutional: She is oriented to Carr, place, and time. She appears well-developed and well-nourished.  HENT:  Head: Normocephalic and atraumatic.  Right Ear: External ear normal.  Left Ear: External ear normal.  Nose: Nose normal.  Mouth/Throat: Oropharynx is clear and moist.  TMs and canals are clear.   Eyes: Pupils are equal, round, and reactive to light. Conjunctivae and EOM are normal.  Neck: No thyromegaly present.  Cardiovascular: Normal rate, regular rhythm and normal heart sounds.  Pulmonary/Chest: Effort normal and breath sounds normal. She has no wheezes.  Abdominal: Soft. Bowel sounds are normal. She exhibits no distension. There is no abdominal tenderness.  Musculoskeletal:     Cervical back: Neck supple.  Lymphadenopathy:    She has no cervical adenopathy.  Neurological: She is alert and oriented to Carr, place, and time.  Skin: Skin is warm and dry.  Psychiatric: She has a normal mood and affect. Her behavior is normal.    BP 133/61   Pulse 67   Ht 5\' 6"  (1.676 m)   Wt 131 lb (59.4 kg)   LMP 06/19/2019 (Approximate)   SpO2 100%   BMI 21.14 kg/m  Wt Readings from Last 3 Encounters:  07/17/19 131 lb (59.4 kg)  08/22/18 142 lb (64.4 kg)  03/02/18 139 lb (63 kg)     There are no preventive care reminders to display for this patient.  There are no preventive care reminders to display for this patient.  Lab Results  Component Value Date   TSH 1.48 11/30/2017   Lab Results  Component Value Date   WBC 5.7 11/30/2017   HGB 14.0 11/30/2017   HCT 41.6 11/30/2017   MCV 91.2 11/30/2017   PLT 207 11/30/2017   Lab Results  Component Value Date   NA 137 11/30/2017   K 4.0 11/30/2017   CO2 26 11/30/2017   GLUCOSE 94 11/30/2017   BUN 14 11/30/2017   CREATININE 0.87 11/30/2017   BILITOT 0.9 11/30/2017   ALKPHOS 41 05/28/2015   AST 22 11/30/2017   ALT 17 11/30/2017   PROT 7.0 11/30/2017   ALBUMIN 4.6 05/28/2015   CALCIUM 9.5 11/30/2017    Lab Results  Component Value Date   CHOL 217 (H) 11/30/2017   Lab Results  Component Value Date   HDL 100 11/30/2017   Lab Results  Component Value Date   LDLCALC 102 (H) 11/30/2017   Lab Results  Component Value Date   TRIG 66 11/30/2017  Lab Results  Component Value Date   CHOLHDL 2.2 11/30/2017   No results found for: HGBA1C    Assessment & Plan:   Problem List Items Addressed This Visit      Other   HSV (herpes simplex virus) anogenital infection   Relevant Medications   acyclovir (ZOVIRAX) 400 MG tablet    Other Visit Diagnoses    Wellness examination    -  Primary     Keep up a regular exercise program and make sure you are eating a healthy diet Try to eat 4 servings of dairy a day, or if you are lactose intolerant take a calcium with vitamin D daily.  Your vaccines are up to date.   It does sound like she is having some grinding over the TMJ joints not popping or clicking which is reassuring.  Offered a trial of muscle relaxer bedtime but she declined.  Recommend heat and stretching.  May want to also speak with her dentist to make sure that she is not clenching or grinding at night.   Meds ordered this encounter  Medications  . acyclovir (ZOVIRAX) 400 MG tablet    Sig: Take 1 tablet (400 mg total) by mouth daily as needed.    Dispense:  90 tablet    Refill:  1  . albuterol (VENTOLIN HFA) 108 (90 Base) MCG/ACT inhaler    Sig: Inhale 1-2 puffs into the lungs every 6 (six) hours as needed for wheezing or shortness of breath.    Dispense:  18 g    Refill:  prn    Follow-up: Return in about 1 year (around 07/16/2020) for Wellness Exam with pap smear .    Beatrice Lecher, MD

## 2019-07-17 NOTE — Patient Instructions (Signed)

## 2019-10-02 IMAGING — DX DG CHEST 2V
2 series · 2 of 2 positions shown · non-contrast
Comparison: No prior chest imaging.

CLINICAL DATA: 57-year-old female with cough for 9 days. Increased
shortness of breath today.

EXAM:
CHEST - 2 VIEW

[chest pa]
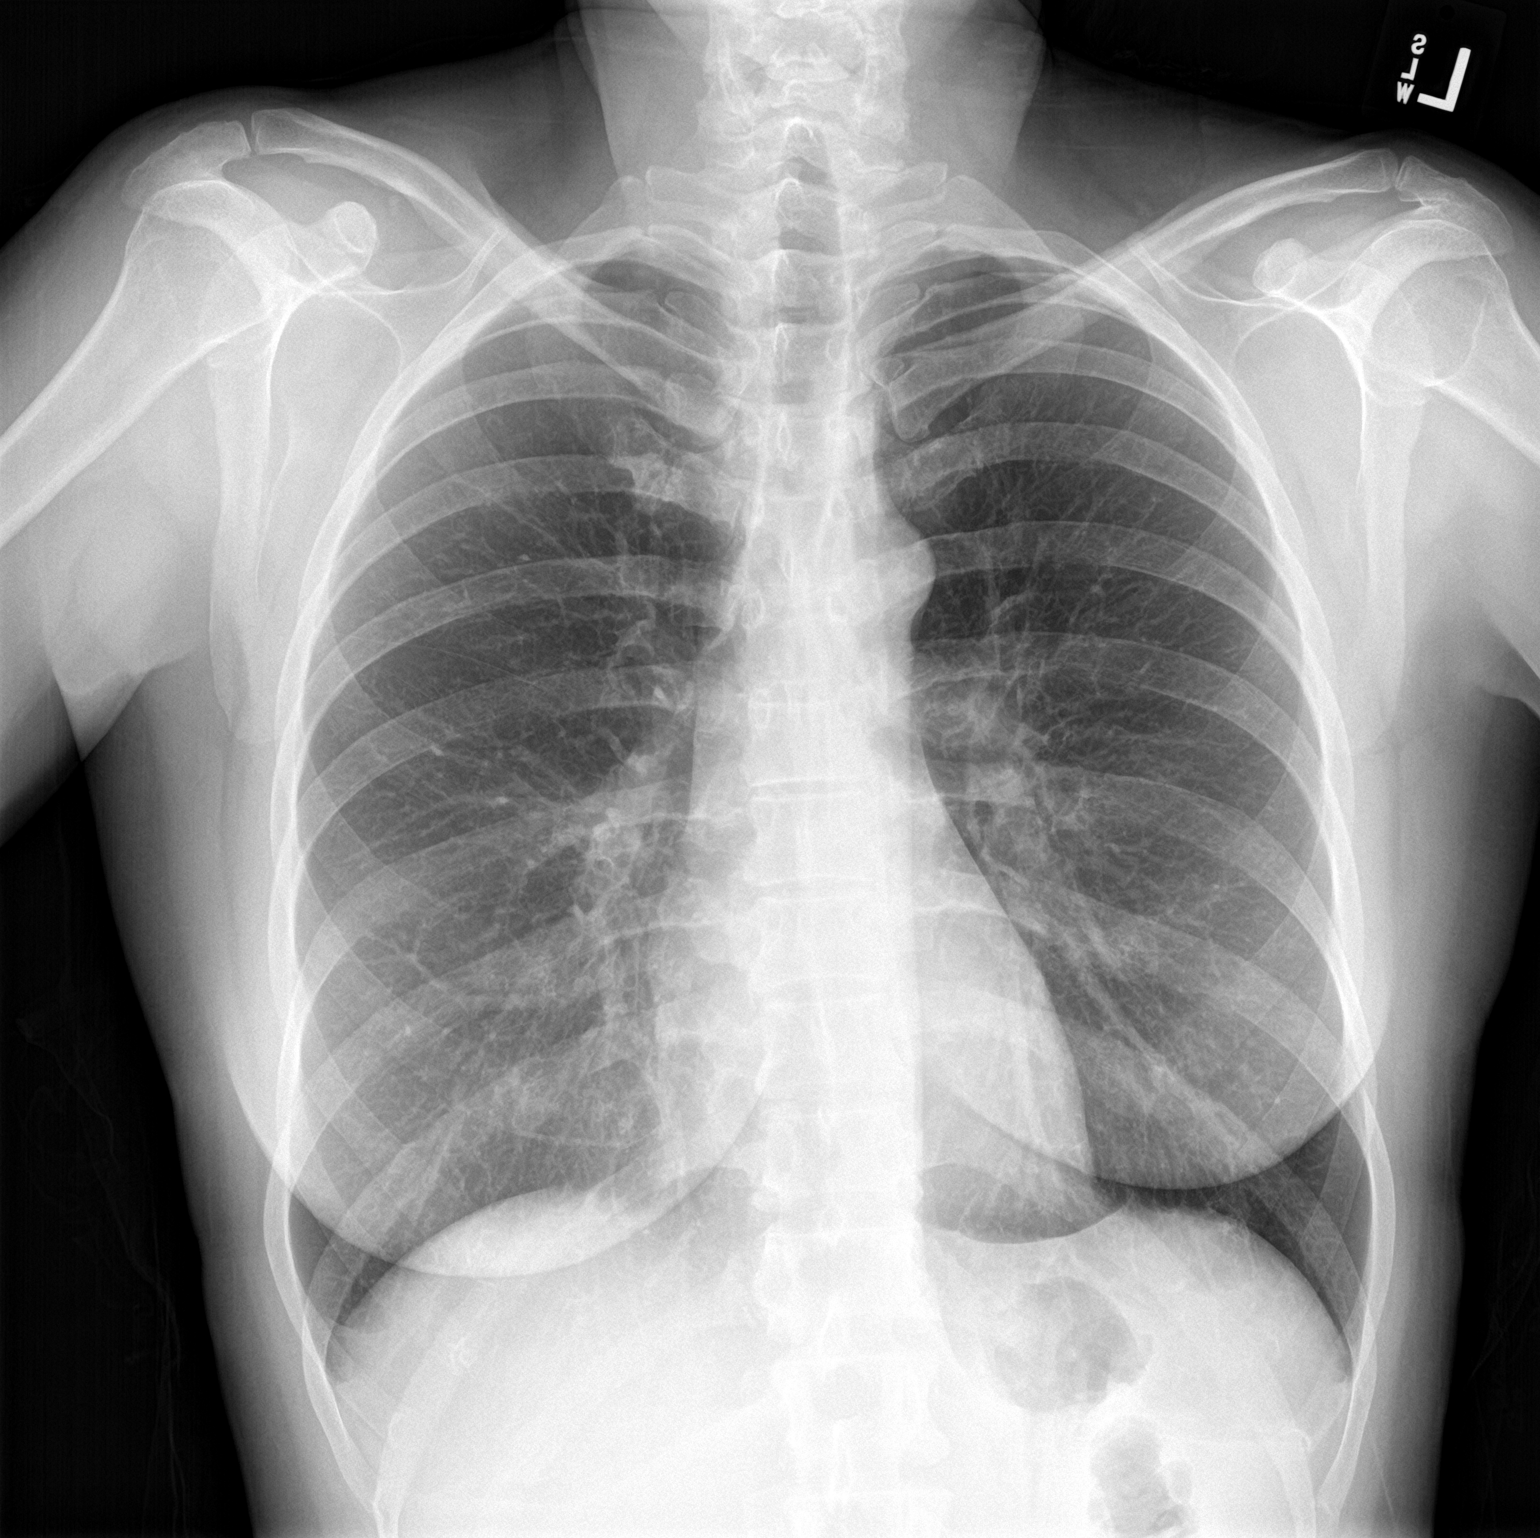

[chest lat]
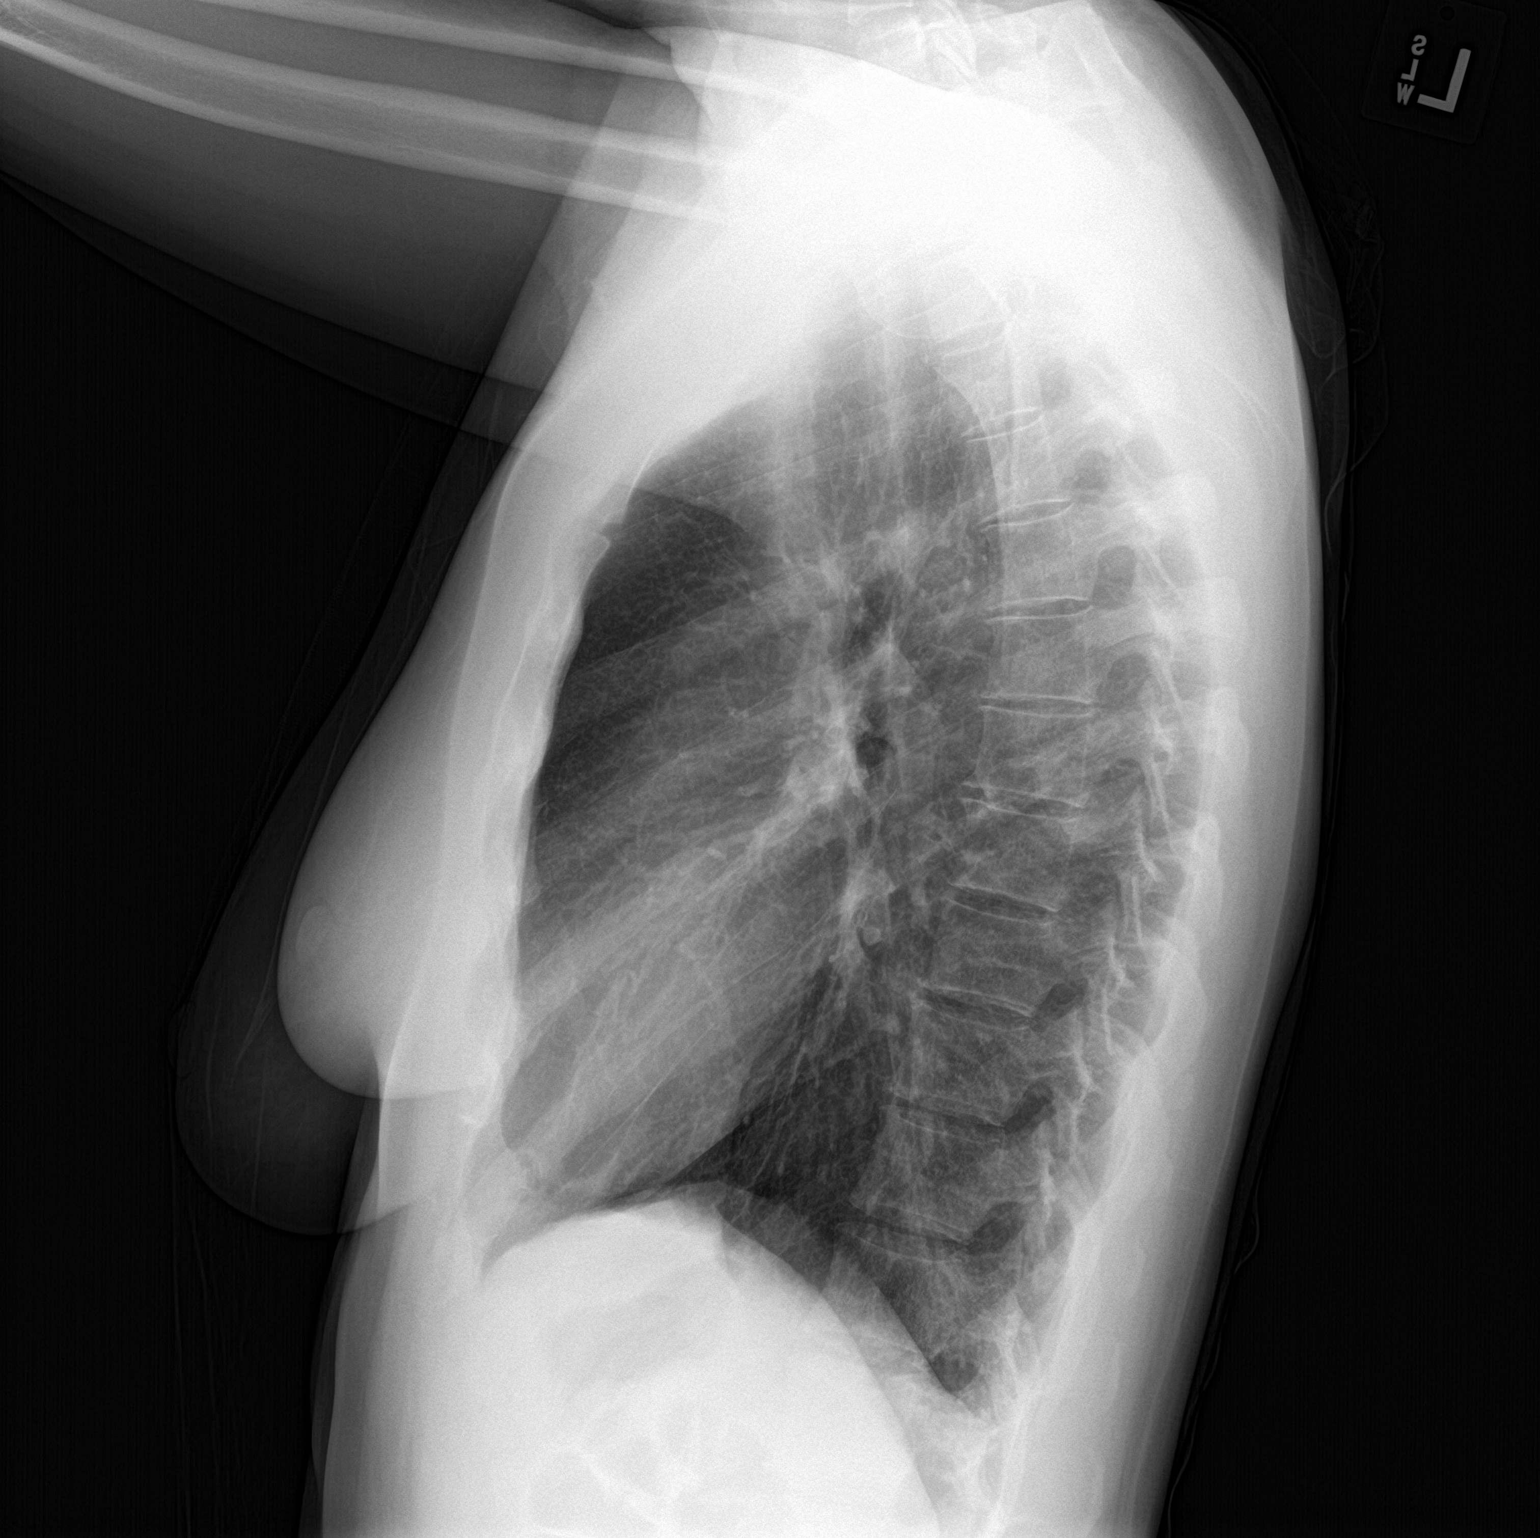

[2 of 2 positions shown; findings below may reference images not displayed]

FINDINGS: Normal cardiac size and mediastinal contours. Lung volumes are
within normal limits. Visualized tracheal air column is within
normal limits. No pneumothorax, pulmonary edema, pleural effusion or
confluent pulmonary opacity identified. Negative visible osseous
structures and bowel gas.
IMPRESSION: Negative.  No acute cardiopulmonary abnormality identified.

## 2020-03-12 ENCOUNTER — Other Ambulatory Visit: Payer: Self-pay | Admitting: Family Medicine

## 2020-03-12 DIAGNOSIS — A609 Anogenital herpesviral infection, unspecified: Secondary | ICD-10-CM

## 2020-05-14 DIAGNOSIS — Z1231 Encounter for screening mammogram for malignant neoplasm of breast: Secondary | ICD-10-CM | POA: Diagnosis not present

## 2020-05-14 LAB — HM MAMMOGRAPHY

## 2020-05-30 ENCOUNTER — Encounter: Payer: Self-pay | Admitting: Family Medicine

## 2020-07-31 DIAGNOSIS — I781 Nevus, non-neoplastic: Secondary | ICD-10-CM | POA: Diagnosis not present

## 2020-07-31 DIAGNOSIS — L304 Erythema intertrigo: Secondary | ICD-10-CM | POA: Diagnosis not present

## 2020-07-31 DIAGNOSIS — L658 Other specified nonscarring hair loss: Secondary | ICD-10-CM | POA: Diagnosis not present

## 2021-05-15 DIAGNOSIS — Z1231 Encounter for screening mammogram for malignant neoplasm of breast: Secondary | ICD-10-CM | POA: Diagnosis not present

## 2021-05-15 LAB — HM MAMMOGRAPHY

## 2021-06-11 ENCOUNTER — Encounter: Payer: Self-pay | Admitting: Family Medicine

## 2021-08-04 ENCOUNTER — Other Ambulatory Visit (HOSPITAL_COMMUNITY)
Admission: RE | Admit: 2021-08-04 | Discharge: 2021-08-04 | Disposition: A | Discharge status: Home / Self Care | Payer: BC Managed Care – PPO | Source: Ambulatory Visit | Attending: Family Medicine | Admitting: Family Medicine

## 2021-08-04 ENCOUNTER — Ambulatory Visit (INDEPENDENT_AMBULATORY_CARE_PROVIDER_SITE_OTHER): Payer: BC Managed Care – PPO | Admitting: Family Medicine

## 2021-08-04 ENCOUNTER — Encounter: Payer: Self-pay | Admitting: Family Medicine

## 2021-08-04 VITALS — BP 130/60 | HR 71 | Ht 66.0 in | Wt 139.0 lb

## 2021-08-04 DIAGNOSIS — Z0000 Encounter for general adult medical examination without abnormal findings: Secondary | ICD-10-CM

## 2021-08-04 DIAGNOSIS — E7800 Pure hypercholesterolemia, unspecified: Secondary | ICD-10-CM | POA: Diagnosis not present

## 2021-08-04 DIAGNOSIS — Z124 Encounter for screening for malignant neoplasm of cervix: Secondary | ICD-10-CM | POA: Diagnosis not present

## 2021-08-04 NOTE — Progress Notes (Signed)
? ?Complete physical exam ? ?Patient: Shannon Carr   DOB: 1965/08/29   56 y.o. Female  MRN: [id number] ? ?Subjective:  ?  ?Chief Complaint  ?Patient presents with  ? Annual Exam  ? ? ?Shannon Carr is a 56 y.o. female who presents today for a complete physical exam. She reports consuming a  healthy  diet.  Plays tennis regularly  she is having some hotflashes. She generally feels well. She reports sleeping fairly well. She does not have additional problems to discuss today. She is > 12 mo since her LMP.  ? ? ?Most recent fall risk assessment: ? ?  08/04/2021  ?  8:47 AM  ?Fall Risk   ?Falls in the past year? 0  ?Number falls in past yr: 0  ?Injury with Fall? 0  ?Risk for fall due to : No Fall Risks  ?Follow up Falls prevention discussed  ? ?  ?Most recent depression screenings: ? ?  08/04/2021  ?  8:47 AM 07/17/2019  ? 11:12 AM  ?PHQ 2/9 Scores  ?PHQ - 2 Score 0 0  ? ? ? ? ?Past Surgical History:  ?Procedure Laterality Date  ? ANTERIOR AND POSTERIOR REPAIR  12/2012  ? COLONOSCOPY  2017  ? TONSILLECTOMY AND ADENOIDECTOMY    ? ?Social History  ? ?Tobacco Use  ? Smoking status: Never  ? Smokeless tobacco: Never  ?Vaping Use  ? Vaping Use: Never used  ?Substance Use Topics  ? Alcohol use: Yes  ?  Comment: weekly-social  ? Drug use: No  ? ?Family History  ?Problem Relation Age of Onset  ? Melanoma Father   ? Colon cancer Maternal Aunt 72  ? Colon cancer Other   ?     Elderly  ? ?  ? ?Patient Care Team: ?Hali Marry, MD as PCP - General (Family Medicine)  ? ?Outpatient Medications Prior to Visit  ?Medication Sig  ? acyclovir (ZOVIRAX) 400 MG tablet Take 1 tablet (400 mg total) by mouth daily as needed.  ? albuterol (VENTOLIN HFA) 108 (90 Base) MCG/ACT inhaler Inhale 1-2 puffs into the lungs every 6 (six) hours as needed for wheezing or shortness of breath.  ? ?No facility-administered medications prior to visit.  ? ? ?ROS ? ? ? ? ?   ?Objective:  ? ?  ?BP 130/60   Pulse 71   Ht '5\' 6"'$  (1.676 m)   Wt 139 lb (63 kg)    SpO2 100%   BMI 22.44 kg/m?  ? ? ?Physical Exam ?Vitals reviewed. Exam conducted with a chaperone present.  ?Constitutional:   ?   Appearance: She is well-developed.  ?HENT:  ?   Head: Normocephalic and atraumatic.  ?   Right Ear: External ear normal.  ?   Left Ear: External ear normal.  ?   Nose: Nose normal.  ?Eyes:  ?   Conjunctiva/sclera: Conjunctivae normal.  ?   Pupils: Pupils are equal, round, and reactive to light.  ?Neck:  ?   Thyroid: No thyromegaly.  ?Cardiovascular:  ?   Rate and Rhythm: Normal rate and regular rhythm.  ?   Heart sounds: Normal heart sounds.  ?Pulmonary:  ?   Effort: Pulmonary effort is normal.  ?   Breath sounds: Normal breath sounds. No wheezing.  ?Chest:  ?Breasts: ?   Right: Normal. No mass.  ?   Left: Normal. No mass.  ?Abdominal:  ?   Palpations: Abdomen is soft.  ?Genitourinary: ?   General: Normal vulva.  ?  Labia:     ?   Right: No rash.     ?   Left: No rash.   ?   Vagina: Normal.  ?   Cervix: Normal.  ?   Uterus: Normal.   ?   Adnexa: Right adnexa normal and left adnexa normal.  ?Musculoskeletal:  ?   Cervical back: Neck supple.  ?Lymphadenopathy:  ?   Cervical: No cervical adenopathy.  ?Skin: ?   General: Skin is warm and dry.  ?   Coloration: Skin is not pale.  ?Neurological:  ?   Mental Status: She is alert and oriented to person, place, and time.  ?Psychiatric:     ?   Behavior: Behavior normal.  ?  ? ?No results found for any visits on 08/04/21. ? ?   ?Assessment & Plan:  ?  ?Routine Health Maintenance and Physical Exam ? ?Immunization History  ?Administered Date(s) Administered  ? Influenza Split 04/05/2012  ? Influenza, Seasonal, Injecte, Preservative Fre 01/28/2015  ? Td 05/11/2007  ? Tdap 09/14/2016  ? ? ?Health Maintenance  ?Topic Date Due  ? COVID-19 Vaccine (1) Never done  ? Zoster Vaccines- Shingrix (1 of 2) Never done  ? PAP SMEAR-Modifier  11/30/2020  ? INFLUENZA VACCINE  10/21/2021  ? MAMMOGRAM  05/15/2022  ? COLONOSCOPY (Pts 45-91yr Insurance coverage  will need to be confirmed)  03/03/2023  ? TETANUS/TDAP  09/15/2026  ? Hepatitis C Screening  Completed  ? HIV Screening  Completed  ? Pneumococcal Vaccine [id number] old  Aged Out  ? HPV VACCINES  Aged Out  ? ? ?Discussed health benefits of physical activity, and encouraged her to engage in regular exercise appropriate for her age and condition. ? ?Problem List Items Addressed This Visit   ?None ?Visit Diagnoses   ? ? Wellness examination    -  Primary  ? Relevant Orders  ? Lipid Panel w/reflex Direct LDL  ? COMPLETE METABOLIC PANEL WITH GFR  ? CBC  ? TSH  ? Screening for cervical cancer      ? Relevant Orders  ? Cytology - PAP  ? ?  ? ?Keep up a regular exercise program and make sure you are eating a healthy diet ?Try to eat 4 servings of dairy a day, or if you are lactose intolerant take a calcium with vitamin D daily.  ?Your vaccines are up to date.  ? ?Return in about 1 year (around 08/05/2022) for CPE. ? ?  ? ?CBeatrice Lecher MD ? ? ?

## 2021-08-05 LAB — LIPID PANEL W/REFLEX DIRECT LDL
Cholesterol: 291 mg/dL — ABNORMAL HIGH (ref <200)
HDL: 105 mg/dL (ref >=50)
LDL Cholesterol (Calc): 169 mg/dL (calc) — ABNORMAL HIGH
Non-HDL Cholesterol (Calc): 186 mg/dL (calc) — ABNORMAL HIGH (ref <130)
Total CHOL/HDL Ratio: 2.8 (calc) (ref <5.0)
Triglycerides: 71 mg/dL (ref <150)

## 2021-08-05 LAB — COMPLETE METABOLIC PANEL WITH GFR
AG Ratio: 1.8 (calc) (ref 1.0–2.5)
ALT: 16 U/L (ref 6–29)
AST: 19 U/L (ref 10–35)
Albumin: 4.6 g/dL (ref 3.6–5.1)
Alkaline phosphatase (APISO): 74 U/L (ref 37–153)
BUN: 18 mg/dL (ref 7–25)
CO2: 28 mmol/L (ref 20–32)
Calcium: 9.7 mg/dL (ref 8.6–10.4)
Chloride: 104 mmol/L (ref 98–110)
Creat: 0.87 mg/dL (ref 0.50–1.03)
Globulin: 2.5 g/dL (calc) (ref 1.9–3.7)
Glucose, Bld: 93 mg/dL (ref 65–99)
Potassium: 4.5 mmol/L (ref 3.5–5.3)
Sodium: 141 mmol/L (ref 135–146)
Total Bilirubin: 0.4 mg/dL (ref 0.2–1.2)
Total Protein: 7.1 g/dL (ref 6.1–8.1)
eGFR: 79 mL/min/{1.73_m2} (ref >=60)

## 2021-08-05 LAB — TSH: TSH: 1.6 mIU/L

## 2021-08-05 LAB — CBC
HCT: 43.2 % (ref 35.0–45.0)
Hemoglobin: 14.6 g/dL (ref 11.7–15.5)
MCH: 31.3 pg (ref 27.0–33.0)
MCHC: 33.8 g/dL (ref 32.0–36.0)
MCV: 92.5 fL (ref 80.0–100.0)
MPV: 11.4 fL (ref 7.5–12.5)
Platelets: 221 10*3/uL (ref 140–400)
RBC: 4.67 10*6/uL (ref 3.80–5.10)
RDW: 13.4 % (ref 11.0–15.0)
WBC: 5.6 10*3/uL (ref 3.8–10.8)

## 2021-08-06 NOTE — Progress Notes (Signed)
Hi Shannon Carr, LDL cholesterol jumped up significantly compared to 3 years ago.  It seems particularly high so would like to recheck that again in maybe 4 to 6 months.  Your metabolic panel looks great though.  Blood count is also normal.  No anemia.  Thyroid looks great at 1.6.  Your Pap smear is also normal. ? ?Please call and add HPV testing.  Normally we put this on all the request for patients over 30.  But it does not look like it was run on this particular sample.

## 2021-08-07 DIAGNOSIS — D225 Melanocytic nevi of trunk: Secondary | ICD-10-CM | POA: Diagnosis not present

## 2021-08-07 DIAGNOSIS — D235 Other benign neoplasm of skin of trunk: Secondary | ICD-10-CM | POA: Diagnosis not present

## 2021-08-07 DIAGNOSIS — L658 Other specified nonscarring hair loss: Secondary | ICD-10-CM | POA: Diagnosis not present

## 2021-08-07 DIAGNOSIS — T148XXA Other injury of unspecified body region, initial encounter: Secondary | ICD-10-CM | POA: Diagnosis not present

## 2021-08-08 LAB — CYTOLOGY - PAP
Comment: NEGATIVE
Diagnosis: NEGATIVE
High risk HPV: NEGATIVE

## 2021-08-08 NOTE — Progress Notes (Signed)
Your Pap smear is normal. You are negative for HPV as well. Repeat pap smear in 5 years.

## 2021-08-26 ENCOUNTER — Encounter: Payer: BC Managed Care – PPO | Admitting: Family Medicine

## 2022-01-15 ENCOUNTER — Telehealth (INDEPENDENT_AMBULATORY_CARE_PROVIDER_SITE_OTHER): Payer: BC Managed Care – PPO | Admitting: Family Medicine

## 2022-01-15 ENCOUNTER — Encounter: Payer: Self-pay | Admitting: Family Medicine

## 2022-01-15 DIAGNOSIS — R310 Gross hematuria: Secondary | ICD-10-CM | POA: Diagnosis not present

## 2022-01-15 DIAGNOSIS — N2889 Other specified disorders of kidney and ureter: Secondary | ICD-10-CM

## 2022-01-15 DIAGNOSIS — N289 Disorder of kidney and ureter, unspecified: Secondary | ICD-10-CM

## 2022-01-15 NOTE — Progress Notes (Signed)
Virtual Visit via Video Note  I connected with Shannon Carr on 01/15/22 at  2:20 PM EDT by a video enabled telemedicine application and verified that I am speaking with the correct person using two identifiers.   I discussed the limitations of evaluation and management by telemedicine and the availability of in person appointments. The patient expressed understanding and agreed to proceed.  Patient location: at home Provider location: in office  Subjective:    CC:  No chief complaint on file.   HPI: Friday she left work and had some fullness and cramping in her abdomen.  She thought she might She went to the emergency department on October 23.  Had a CT abdomen pelvis.  She had started experiencing frank hematuria and right flank pain.  There was no hydronephrosis no renal or ureteral calculi.  There was a hypoenhancing 1.8 cm lesion along the inferior pole of the right kidney.  Water was normal in appearance.  They gave her 2 g of ceftriaxone IV.  She never actually experienced any urinary tract symptoms such as dysuria.  That she has had a little bit of incontinence today which is not typical for her.  She still seeing blood in the urine.  No longer clots but she is also been pushing a lot more fluids.  She has not had any pelvic pain.  No fevers or chills.  No vaginal discomfort and no vaginal bleeding.  Please see scanned document with details of report.  We do not have actual images.  But serum creatinine was 0.8.  CBC was reassuring.  Greater than 100 red blood cells on the urine microscopic analysis there was moderate amorphous sediment no casts.   Past medical history, Surgical history, Family history not pertinant except as noted below, Social history, Allergies, and medications have been entered into the medical record, reviewed, and corrections made.    Objective:    General: Speaking clearly in complete sentences without any shortness of breath.  Alert and oriented x3.   Normal judgment. No apparent acute distress.    Impression and Recommendations:    Problem List Items Addressed This Visit       Genitourinary   Kidney lesion, native, right - Primary    1.8 cm lesion at the inferior pole.  They had recommended imaging specifically with renal mass protocol for CT.  We will get scheduled ASAP hopefully we can get it done over the weekend.      Relevant Orders   Ambulatory referral to Urology   CT RENAL ABD W/WO   Gross hematuria    gross hematuria-I am not convinced it is directly related to the lesion seen.  I think this may be a separate issue.  No evidence of UTI.  Urine culture was negative and no symptoms of UTI except for the hematuria.  No sign of kidney stone.  Neck step would be referral to urology for further evaluation will likely need cystoscopy of the bladder.  No worrisome findings on CT of the bladder seen.  This is reassuring.      Relevant Orders   Ambulatory referral to Urology   CT RENAL ABD W/WO   Other Visit Diagnoses     Other specified disorders of kidney and ureter       Relevant Orders   Ambulatory referral to Urology   CT RENAL ABD W/WO       Orders Placed This Encounter  Procedures   CT RENAL ABD W/WO  K to schedule at [place] if not available at [facility].    Standing Status:   Future    Standing Expiration Date:   01/16/2023    Order Specific Question:   If indicated for the ordered procedure, I authorize the administration of contrast media per Radiology protocol    Answer:   Yes    Order Specific Question:   Is patient pregnant?    Answer:   No    Order Specific Question:   Preferred imaging location?    Answer:   [facility]   Ambulatory referral to Urology    Referral Priority:   Urgent    Referral Type:   Consultation    Referral Reason:   Specialty Services Required    Requested Specialty:   Urology    Number of Visits Requested:   1    No orders of the defined  types were placed in this encounter.    I discussed the assessment and treatment plan with the patient. The patient was provided an opportunity to ask questions and all were answered. The patient agreed with the plan and demonstrated an understanding of the instructions.   The patient was advised to call back or seek an in-person evaluation if the symptoms worsen or if the condition fails to improve as anticipated. I spent 30 minutes on the day of the encounter to include pre-visit record review, face-to-face time with the patient and post visit ordering of test.   Beatrice Lecher, MD

## 2022-01-15 NOTE — Assessment & Plan Note (Signed)
1.8 cm lesion at the inferior pole.  They had recommended imaging specifically with renal mass protocol for CT.  We will get scheduled ASAP hopefully we can get it done over the weekend.

## 2022-01-15 NOTE — Assessment & Plan Note (Signed)
gross hematuria-I am not convinced it is directly related to the lesion seen.  I think this may be a separate issue.  No evidence of UTI.  Urine culture was negative and no symptoms of UTI except for the hematuria.  No sign of kidney stone.  Neck step would be referral to urology for further evaluation will likely need cystoscopy of the bladder.  No worrisome findings on CT of the bladder seen.  This is reassuring.

## 2022-01-19 ENCOUNTER — Ambulatory Visit (INDEPENDENT_AMBULATORY_CARE_PROVIDER_SITE_OTHER): Payer: BC Managed Care – PPO | Admitting: Physician Assistant

## 2022-01-19 ENCOUNTER — Ambulatory Visit (INDEPENDENT_AMBULATORY_CARE_PROVIDER_SITE_OTHER): Payer: BC Managed Care – PPO

## 2022-01-19 VITALS — BP 132/90 | HR 64 | Temp 97.8°F | Ht 66.0 in | Wt 138.0 lb

## 2022-01-19 DIAGNOSIS — R1030 Lower abdominal pain, unspecified: Secondary | ICD-10-CM

## 2022-01-19 DIAGNOSIS — R32 Unspecified urinary incontinence: Secondary | ICD-10-CM

## 2022-01-19 DIAGNOSIS — R310 Gross hematuria: Secondary | ICD-10-CM

## 2022-01-19 DIAGNOSIS — N289 Disorder of kidney and ureter, unspecified: Secondary | ICD-10-CM | POA: Diagnosis not present

## 2022-01-19 DIAGNOSIS — N2889 Other specified disorders of kidney and ureter: Secondary | ICD-10-CM

## 2022-01-19 DIAGNOSIS — N390 Urinary tract infection, site not specified: Secondary | ICD-10-CM | POA: Diagnosis not present

## 2022-01-19 LAB — POCT URINALYSIS DIP (CLINITEK)
Glucose, UA: NEGATIVE mg/dL
Nitrite, UA: POSITIVE — AB
POC PROTEIN,UA: >=300 — AB
Spec Grav, UA: 1.015 (ref 1.010–1.025)
Urobilinogen, UA: 4 E.U./dL — AB
pH, UA: 5.5 (ref 5.0–8.0)

## 2022-01-19 MED ORDER — TRAMADOL HCL 50 MG PO TABS: 50.0000 mg | ORAL_TABLET | Freq: Four times a day (QID) | ORAL | 0 refills | Status: AC | PRN

## 2022-01-19 MED ORDER — PHENAZOPYRIDINE HCL 200 MG PO TABS: 200.0000 mg | ORAL_TABLET | Freq: Three times a day (TID) | ORAL | 0 refills | Status: DC | PRN

## 2022-01-19 MED ORDER — FINASTERIDE 1 MG PO TABS: 1.0000 mg | ORAL_TABLET | Freq: Every day | ORAL | 0 refills | Status: DC

## 2022-01-19 MED ORDER — IOHEXOL 300 MG/ML  SOLN
100.0000 mL | Freq: Once | INTRAMUSCULAR | Status: AC | PRN
  Administered 2022-01-19: 100 mL via INTRAVENOUS

## 2022-01-19 NOTE — Progress Notes (Unsigned)
Acute Office Visit  Subjective:     Patient ID: Shannon Carr, female    DOB: [birth year], 56 y.o.   MRN: [id number]  Chief Complaint  Patient presents with   Hematuria    HPI Patient is in today for lower abdominal pain, dysuria, urinary leakage, suprapubic pressure for the last week since ED visit on 01/12/2022. She had a cT scan that showed a right kidney lesion and she is getting a renal CT done today. Despite no hydronephrosis she was treated with rocephin and omnicef for kidney infection. The urine culture came back no bacterial growth so she stopped omnicef. Pt continues to be in discomfort that is worsening.  .. 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      Objective:    BP (!) 132/90   Pulse 64   Temp 97.8 F (36.6 C) (Oral)   Ht '5\' 6"'$  (1.676 m)   Wt 138 lb (62.6 kg)   LMP 06/19/2019 (Approximate)   SpO2 100%   BMI 22.27 kg/m  BP Readings from Last 3 Encounters:  01/19/22 (!) 132/90  08/04/21 130/60  07/17/19 133/61   Wt Readings from Last 3 Encounters:  01/19/22 138 lb (62.6 kg)  08/04/21 139 lb (63 kg)  07/17/19 131 lb (59.4 kg)    .Marland Kitchen Results for orders placed or performed in visit on 01/19/22  POCT URINALYSIS DIP (CLINITEK)  Result Value Ref Range   Color, UA red (A) yellow   Clarity, UA cloudy (A) clear   Glucose, UA negative negative mg/dL   Bilirubin, UA large (A) negative   Ketones, POC UA small (15)  (A) negative mg/dL   Spec Grav, UA 1.015 1.010 - 1.025   Blood, UA large (A) negative   pH, UA 5.5 5.0 - 8.0   POC PROTEIN,UA >=300 (A) negative, trace   Urobilinogen, UA 4.0 (A) 0.2 or 1.0 E.U./dL   Nitrite, UA Positive (A) Negative   Leukocytes, UA Large (3+) (A) Negative     Physical Exam Constitutional:      Appearance: Normal appearance.  HENT:     Head: Normocephalic.  Cardiovascular:     Rate and Rhythm: Normal rate and regular rhythm.     Pulses: Normal pulses.     Heart sounds: Normal heart sounds.  Pulmonary:     Effort: Pulmonary effort is normal.     Breath sounds: Normal breath sounds.  Abdominal:     General: Bowel sounds are normal. There is no distension.     Palpations: Abdomen is soft. There is no mass.     Tenderness: There is abdominal tenderness. There is no right CVA tenderness, left CVA tenderness, guarding or rebound.     Hernia: No hernia is present.     Comments: Suprapubic tenderness to palpation  No inguinal lymph nodes to palpate  Musculoskeletal:     Right lower leg: No edema.  Left lower leg: No edema.  Neurological:     General: No focal deficit present.     Mental Status: She is alert.  Psychiatric:        Mood and Affect: Mood normal.           Assessment & Plan:  Marland KitchenMarland KitchenApril was seen today for hematuria.  Diagnoses and all orders for this visit:  Gross hematuria -     POCT URINALYSIS DIP (CLINITEK) -     Urine Culture -     Urine Microscopic -     phenazopyridine (PYRIDIUM) 200 MG tablet; Take 1 tablet (200 mg total) by mouth 3 (three) times daily as needed for pain.  Lower abdominal pain -     POCT URINALYSIS DIP (CLINITEK) -     Urine Culture -     Urine Microscopic -     traMADol (ULTRAM) 50 MG tablet; Take 1 tablet (50 mg total) by mouth every 6 (six) hours as needed for up to 5 days. -     phenazopyridine (PYRIDIUM) 200 MG tablet; Take 1 tablet (200 mg total) by mouth 3 (three) times daily as needed for  pain.  Kidney lesion, native, right  Urinary incontinence, unspecified type -     phenazopyridine (PYRIDIUM) 200 MG tablet; Take 1 tablet (200 mg total) by mouth 3 (three) times daily as needed for pain.   Unclear etiology of symptoms but patient appears to be in a lot of discomfort today UA is full of blood, protein, nitrites, leukocytes but patient's most recent culture did not grow in any bacteria and she was told to stop omnicef No fever, chills, body aches, flank pain Will culture again Start pyridium for 3 days Will get CT renal scan today for kidney lesion  Tramadol for breakthrough pain  .Marland KitchenPDMP reviewed during this encounter. No concerns Cystoscopy was scheduled for November 17th but patient I desperate to get that moved up    Iran Planas, PA-C

## 2022-01-19 NOTE — Patient Instructions (Signed)
Will culture urine. Get CT today.  Start pyridium and use tramadol for pain  Hematuria, Adult Hematuria is blood in the urine. Blood may be visible in the urine, or it may be identified with a test. This condition can be caused by infections of the bladder, urethra, kidney, or prostate. Other possible causes include: Kidney stones. Cancer of the urinary tract. Too much calcium in the urine. Conditions that are passed from parent to child (inherited conditions). Exercise that requires a lot of energy. Infections can usually be treated with medicine, and a kidney stone usually will pass through your urine. If neither of these is the cause of your hematuria, more tests may be needed to identify the cause of your symptoms. It is very important to tell your health care provider about any blood in your urine, even if it is painless or the blood stops without treatment. Blood in the urine, when it happens and then stops and then happens again, can be a symptom of a very serious condition, including cancer. There is no pain in the initial stages of many urinary cancers. Follow these instructions at home: Medicines Take over-the-counter and prescription medicines only as told by your health care provider. If you were prescribed an antibiotic medicine, take it as told by your health care provider. Do not stop taking the antibiotic even if you start to feel better. Eating and drinking Drink enough fluid to keep your urine pale yellow. It is recommended that you drink 3-4 quarts (2.8-3.8 L) a day. If you have been diagnosed with an infection, drinking cranberry juice in addition to large amounts of water is recommended. Avoid caffeine, tea, and carbonated beverages. These tend to irritate the bladder. Avoid alcohol because it may irritate the prostate (in males). General instructions If you have been diagnosed with a kidney stone, follow your health care provider's instructions about straining your urine  to catch the stone. Empty your bladder often. Avoid holding urine for long periods of time. If you are female: After a bowel movement, wipe from front to back and use each piece of toilet paper only once. Empty your bladder before and after sex. Pay attention to any changes in your symptoms. Tell your health care provider about any changes or any new symptoms. It is up to you to get the results of any tests. Ask your health care provider, or the department that is doing the test, when your results will be ready. Keep all follow-up visits. This is important. Contact a health care provider if: You develop back pain. You have a fever or chills. You have nausea or vomiting. Your symptoms do not improve after 3 days. Your symptoms get worse. Get help right away if: You develop severe vomiting and are unable to take medicine without vomiting. You develop severe pain in your back or abdomen even though you are taking medicine. You pass a large amount of blood in your urine. You pass blood clots in your urine. You feel very weak or like you might faint. You faint. Summary Hematuria is blood in the urine. It has many possible causes. It is very important that you tell your health care provider about any blood in your urine, even if it is painless or the blood stops without treatment. Take over-the-counter and prescription medicines only as told by your health care provider. Drink enough fluid to keep your urine pale yellow. This information is not intended to replace advice given to you by your health care provider. Make  sure you discuss any questions you have with your health care provider. Document Revised: 11/08/2019 Document Reviewed: 11/08/2019 Elsevier Patient Education  [place].

## 2022-01-20 ENCOUNTER — Other Ambulatory Visit: Payer: Self-pay

## 2022-01-20 ENCOUNTER — Encounter (HOSPITAL_COMMUNITY): Payer: Self-pay | Admitting: Urology

## 2022-01-20 ENCOUNTER — Ambulatory Visit (HOSPITAL_COMMUNITY): Payer: BC Managed Care – PPO | Admitting: Certified Registered"

## 2022-01-20 ENCOUNTER — Telehealth: Payer: Self-pay | Admitting: Neurology

## 2022-01-20 ENCOUNTER — Ambulatory Visit (HOSPITAL_COMMUNITY)
Admission: RE | Admit: 2022-01-20 | Discharge: 2022-01-20 | Disposition: A | Discharge status: Home / Self Care | Payer: BC Managed Care – PPO | Source: Ambulatory Visit | Attending: Urology | Admitting: Urology

## 2022-01-20 ENCOUNTER — Ambulatory Visit (HOSPITAL_COMMUNITY): Payer: BC Managed Care – PPO

## 2022-01-20 ENCOUNTER — Encounter (HOSPITAL_COMMUNITY)
Admission: RE | Disposition: A | Discharge status: Home / Self Care | Payer: Self-pay | Source: Ambulatory Visit | Attending: Urology

## 2022-01-20 ENCOUNTER — Other Ambulatory Visit: Payer: Self-pay | Admitting: Urology

## 2022-01-20 DIAGNOSIS — R310 Gross hematuria: Secondary | ICD-10-CM | POA: Diagnosis not present

## 2022-01-20 DIAGNOSIS — N059 Unspecified nephritic syndrome with unspecified morphologic changes: Secondary | ICD-10-CM | POA: Diagnosis not present

## 2022-01-20 DIAGNOSIS — N739 Female pelvic inflammatory disease, unspecified: Secondary | ICD-10-CM | POA: Diagnosis not present

## 2022-01-20 DIAGNOSIS — N289 Disorder of kidney and ureter, unspecified: Secondary | ICD-10-CM | POA: Diagnosis not present

## 2022-01-20 DIAGNOSIS — N2889 Other specified disorders of kidney and ureter: Secondary | ICD-10-CM | POA: Diagnosis not present

## 2022-01-20 HISTORY — PX: CYSTOSCOPY WITH RETROGRADE PYELOGRAM, URETEROSCOPY AND STENT PLACEMENT: SHX5789

## 2022-01-20 LAB — BASIC METABOLIC PANEL
Anion gap: 13 (ref 5–15)
BUN: 16 mg/dL (ref 6–20)
CO2: 25 mmol/L (ref 22–32)
Calcium: 9.5 mg/dL (ref 8.9–10.3)
Chloride: 103 mmol/L (ref 98–111)
Creatinine, Ser: 0.94 mg/dL (ref 0.44–1.00)
GFR, Estimated: >60 mL/min (ref >60)
Glucose, Bld: 98 mg/dL (ref 70–99)
Potassium: 3.3 mmol/L — ABNORMAL LOW (ref 3.5–5.1)
Sodium: 141 mmol/L (ref 135–145)

## 2022-01-20 LAB — CBC
HCT: 40.6 % (ref 36.0–46.0)
Hemoglobin: 13.6 g/dL (ref 12.0–15.0)
MCH: 31.6 pg (ref 26.0–34.0)
MCHC: 33.5 g/dL (ref 30.0–36.0)
MCV: 94.4 fL (ref 80.0–100.0)
Platelets: 256 10*3/uL (ref 150–400)
RBC: 4.3 MIL/uL (ref 3.87–5.11)
RDW: 12.2 % (ref 11.5–15.5)
WBC: 7.7 10*3/uL (ref 4.0–10.5)
nRBC: 0 % (ref 0.0–0.2)

## 2022-01-20 SURGERY — CYSTOURETEROSCOPY, WITH RETROGRADE PYELOGRAM AND STENT INSERTION
Anesthesia: General | Laterality: Right

## 2022-01-20 MED ORDER — LACTATED RINGERS IV SOLN: INTRAVENOUS | Status: DC

## 2022-01-20 MED ORDER — CHLORHEXIDINE GLUCONATE 0.12 % MT SOLN
15.0000 mL | Freq: Once | OROMUCOSAL | Status: AC
  Administered 2022-01-20: 15 mL via OROMUCOSAL

## 2022-01-20 MED ORDER — DEXAMETHASONE SODIUM PHOSPHATE 10 MG/ML IJ SOLN
INTRAMUSCULAR | Status: AC
  Filled 2022-01-20: qty 1

## 2022-01-20 MED ORDER — ONDANSETRON HCL 4 MG/2ML IJ SOLN
INTRAMUSCULAR | Status: DC | PRN
  Administered 2022-01-20: 4 mg via INTRAVENOUS

## 2022-01-20 MED ORDER — CIPROFLOXACIN IN D5W 400 MG/200ML IV SOLN
INTRAVENOUS | Status: DC | PRN
  Administered 2022-01-20: 400 mg via INTRAVENOUS

## 2022-01-20 MED ORDER — PHENYLEPHRINE HCL-NACL 20-0.9 MG/250ML-% IV SOLN
INTRAVENOUS | Status: DC | PRN
  Administered 2022-01-20: 10 ug/min via INTRAVENOUS

## 2022-01-20 MED ORDER — ONDANSETRON HCL 4 MG/2ML IJ SOLN: 4.0000 mg | Freq: Once | INTRAMUSCULAR | Status: DC | PRN

## 2022-01-20 MED ORDER — DEXAMETHASONE SODIUM PHOSPHATE 10 MG/ML IJ SOLN
INTRAMUSCULAR | Status: DC | PRN
  Administered 2022-01-20: 8 mg via INTRAVENOUS

## 2022-01-20 MED ORDER — SODIUM CHLORIDE 0.9 % IR SOLN
Status: DC | PRN
  Administered 2022-01-20: 3000 mL

## 2022-01-20 MED ORDER — EPHEDRINE 5 MG/ML INJ
INTRAVENOUS | Status: AC
  Filled 2022-01-20: qty 5

## 2022-01-20 MED ORDER — FENTANYL CITRATE (PF) 100 MCG/2ML IJ SOLN
INTRAMUSCULAR | Status: AC
  Filled 2022-01-20: qty 2

## 2022-01-20 MED ORDER — ONDANSETRON HCL 4 MG/2ML IJ SOLN
INTRAMUSCULAR | Status: AC
  Filled 2022-01-20: qty 2

## 2022-01-20 MED ORDER — PROPOFOL 10 MG/ML IV BOLUS
INTRAVENOUS | Status: AC
  Filled 2022-01-20: qty 20

## 2022-01-20 MED ORDER — MIDAZOLAM HCL 5 MG/5ML IJ SOLN
INTRAMUSCULAR | Status: DC | PRN
  Administered 2022-01-20: 2 mg via INTRAVENOUS

## 2022-01-20 MED ORDER — TAMSULOSIN HCL 0.4 MG PO CAPS: 0.4000 mg | ORAL_CAPSULE | Freq: Every day | ORAL | 0 refills | Status: DC

## 2022-01-20 MED ORDER — FENTANYL CITRATE (PF) 100 MCG/2ML IJ SOLN
INTRAMUSCULAR | Status: DC | PRN
  Administered 2022-01-20 (×2): 50 ug via INTRAVENOUS

## 2022-01-20 MED ORDER — ACETAMINOPHEN 10 MG/ML IV SOLN
INTRAVENOUS | Status: DC | PRN
  Administered 2022-01-20: 1000 mg via INTRAVENOUS

## 2022-01-20 MED ORDER — HYDROMORPHONE HCL 1 MG/ML IJ SOLN: 0.2500 mg | INTRAMUSCULAR | Status: DC | PRN

## 2022-01-20 MED ORDER — DEXMEDETOMIDINE HCL IN NACL 80 MCG/20ML IV SOLN
INTRAVENOUS | Status: DC | PRN
  Administered 2022-01-20: 8 ug via BUCCAL

## 2022-01-20 MED ORDER — EPHEDRINE SULFATE-NACL 50-0.9 MG/10ML-% IV SOSY
PREFILLED_SYRINGE | INTRAVENOUS | Status: DC | PRN
  Administered 2022-01-20: 5 mg via INTRAVENOUS

## 2022-01-20 MED ORDER — LIDOCAINE HCL (PF) 2 % IJ SOLN
INTRAMUSCULAR | Status: AC
  Filled 2022-01-20: qty 5

## 2022-01-20 MED ORDER — AMISULPRIDE (ANTIEMETIC) 5 MG/2ML IV SOLN: 10.0000 mg | Freq: Once | INTRAVENOUS | Status: DC | PRN

## 2022-01-20 MED ORDER — GLYCOPYRROLATE PF 0.2 MG/ML IJ SOSY
PREFILLED_SYRINGE | INTRAMUSCULAR | Status: DC | PRN
  Administered 2022-01-20: .2 mg via INTRAVENOUS

## 2022-01-20 MED ORDER — CIPROFLOXACIN IN D5W 400 MG/200ML IV SOLN
INTRAVENOUS | Status: AC
  Filled 2022-01-20: qty 200

## 2022-01-20 MED ORDER — ACETAMINOPHEN 10 MG/ML IV SOLN: 1000.0000 mg | Freq: Once | INTRAVENOUS | Status: DC | PRN

## 2022-01-20 MED ORDER — IOHEXOL 300 MG/ML  SOLN
INTRAMUSCULAR | Status: DC | PRN
  Administered 2022-01-20: 30 mL via URETHRAL

## 2022-01-20 MED ORDER — OXYCODONE HCL 5 MG PO TABS
5.0000 mg | ORAL_TABLET | Freq: Once | ORAL | Status: AC | PRN
  Administered 2022-01-20: 5 mg via ORAL

## 2022-01-20 MED ORDER — PROPOFOL 10 MG/ML IV BOLUS
INTRAVENOUS | Status: DC | PRN
  Administered 2022-01-20: 120 mg via INTRAVENOUS

## 2022-01-20 MED ORDER — OXYCODONE HCL 5 MG/5ML PO SOLN: 5.0000 mg | Freq: Once | ORAL | Status: AC | PRN

## 2022-01-20 MED ORDER — ACETAMINOPHEN 10 MG/ML IV SOLN
INTRAVENOUS | Status: AC
  Filled 2022-01-20: qty 100

## 2022-01-20 MED ORDER — OXYCODONE HCL 5 MG PO TABS
ORAL_TABLET | ORAL | Status: AC
  Filled 2022-01-20: qty 1

## 2022-01-20 MED ORDER — MIDAZOLAM HCL 2 MG/2ML IJ SOLN
INTRAMUSCULAR | Status: AC
  Filled 2022-01-20: qty 2

## 2022-01-20 SURGICAL SUPPLY — 20 items
BAG URO CATCHER STRL LF (MISCELLANEOUS) ×1 IMPLANT
BASKET ZERO TIP NITINOL 2.4FR (BASKET) IMPLANT
BSKT STON RTRVL ZERO TP 2.4FR (BASKET) ×1
CATH URETL OPEN 5X70 (CATHETERS) ×1 IMPLANT
CLOTH BEACON ORANGE TIMEOUT ST (SAFETY) ×1 IMPLANT
EXTRACTOR STONE 1.7FRX115CM (UROLOGICAL SUPPLIES) IMPLANT
GLOVE SURG LX STRL 7.5 STRW (GLOVE) ×1 IMPLANT
GOWN STRL REUS W/ TWL XL LVL3 (GOWN DISPOSABLE) ×1 IMPLANT
GOWN STRL REUS W/TWL XL LVL3 (GOWN DISPOSABLE) ×1
GUIDEWIRE ANG ZIPWIRE 038X150 (WIRE) IMPLANT
GUIDEWIRE STR DUAL SENSOR (WIRE) ×1 IMPLANT
KIT BALLN UROMAX 15FX4 (MISCELLANEOUS) IMPLANT
KIT BALLN UROMAX 26 75X4 (MISCELLANEOUS) ×1
MANIFOLD NEPTUNE II (INSTRUMENTS) ×1 IMPLANT
PACK CYSTO (CUSTOM PROCEDURE TRAY) ×1 IMPLANT
SHEATH NAVIGATOR HD 12/14X28 (SHEATH) IMPLANT
SHEATH NAVIGATOR HD 12/14X36 (SHEATH) IMPLANT
STENT URET 6FRX24 CONTOUR (STENTS) IMPLANT
TUBING CONNECTING 10 (TUBING) ×1 IMPLANT
TUBING UROLOGY SET (TUBING) ×1 IMPLANT

## 2022-01-20 NOTE — Telephone Encounter (Signed)
:  Patient left vm late yesterday letting us know urology was able to work her in sooner. She had cystoscopy today. Gadiel John/Dr. Madilyn Fireman - FYI.

## 2022-01-20 NOTE — Discharge Instructions (Signed)
DISCHARGE INSTRUCTIONS FOR KIDNEY STONE/URETERAL STENT   MEDICATIONS:  1. Resume all your other meds from home - except do not take any extra narcotic pain meds that you may have at home.  2. Pyridium is to help with the burning/stinging when you urinate. 3. Tramadol is for moderate/severe pain, otherwise taking upto 1000 mg every 6 hours of plainTylenol will help treat your pain.   4. Take Cipro one hour prior to removal of your stent.  5. Tamsulosin is for ureteral colic and stent discomfort  ACTIVITY:  1. No strenuous activity x 1week  2. No driving while on narcotic pain medications  3. Drink plenty of water  4. Continue to walk at home - you can still get blood clots when you are at home, so keep active, but don't over do it.  5. May return to work/school tomorrow or when you feel ready   BATHING:  1. You can shower and we recommend daily showers  2. You have a string coming from your urethra: The stent string is attached to your ureteral stent. Do not pull on this.   SIGNS/SYMPTOMS TO CALL:  Please call us if you have a fever greater than 101.5, uncontrolled nausea/vomiting, uncontrolled pain, dizziness, unable to urinate, bloody urine, chest pain, shortness of breath, leg swelling, leg pain, redness around wound, drainage from wound, or any other concerns or questions.   You can reach [place] at [phone].   FOLLOW-UP:  1.  You have a string attached to your stent, you may remove it on Friday mid-morning, November 3rd. To do this, pull the strings until the stents are completely removed. You may feel an odd sensation in your back.

## 2022-01-20 NOTE — Anesthesia Postprocedure Evaluation (Signed)
Anesthesia Post Note  Patient: Shannon Carr  Procedure(s) Performed: CYSTOSCOPY WITH bilateral RETROGRADE PYELOGRAM, URETEROSCOPY AND STENT PLACEMENT, right ureteral biopsy (Right)     Patient location during evaluation: PACU Anesthesia Type: General Level of consciousness: awake and alert Pain management: pain level controlled Vital Signs Assessment: post-procedure vital signs reviewed and stable Respiratory status: spontaneous breathing, nonlabored ventilation, respiratory function stable and patient connected to nasal cannula oxygen Cardiovascular status: blood pressure returned to baseline and stable Postop Assessment: no apparent nausea or vomiting Anesthetic complications: no   No notable events documented.  Last Vitals:  Vitals:   01/20/22 2015 01/20/22 2038  BP: 128/78 125/85  Pulse: 61 65  Resp: 15 18  Temp:  36.6 C  SpO2: 99% 98%    Last Pain:  Vitals:   01/20/22 2044  PainSc: 5                  Barnet Glasgow

## 2022-01-20 NOTE — Progress Notes (Signed)
I checked and don't see the reading of CT is back yet from yesterday.

## 2022-01-20 NOTE — Transfer of Care (Signed)
Immediate Anesthesia Transfer of Care Note  Patient: Shannon Carr  Procedure(s) Performed: CYSTOSCOPY WITH bilateral RETROGRADE PYELOGRAM, URETEROSCOPY AND STENT PLACEMENT, right ureteral biopsy (Right)  Patient Location: PACU  Anesthesia Type:General  Level of Consciousness: drowsy and patient cooperative  Airway & Oxygen Therapy: Patient Spontanous Breathing and Patient connected to face mask oxygen  Post-op Assessment: Report given to RN and Post -op Vital signs reviewed and stable  Post vital signs: Reviewed and stable  Last Vitals:  Vitals Value Taken Time  BP 131/82 01/20/22 1945  Temp    Pulse 74 01/20/22 1946  Resp    SpO2 100 % 01/20/22 1946  Vitals shown include unvalidated device data.  Last Pain:  Vitals:   01/20/22 1527  PainSc: 0-No pain         Complications: No notable events documented.

## 2022-01-20 NOTE — Op Note (Addendum)
Preoperative diagnosis:  1. Gross hematuria, right upper tract filling defect  Postoperative diagnosis: 1. Right renal pelvis lesion  Procedure(s): Procedure(s): CYSTOSCOPY WITH bilateral RETROGRADE PYELOGRAM, URETEROSCOPY AND STENT PLACEMENT, right ureteral biopsy  Surgeon: Dr. Louis Meckel  Anesthesia: General LMA  Complications: None  EBL: 2cc  Specimens: ID Type Source Tests Collected by Time Destination  1 : right renal pelvis biopsy Tissue PATH Other SURGICAL PATHOLOGY Ardis Hughs, MD 01/20/2022 1911   A : urine right renal pelvis Urine PATH Cytology Urine Eos CYTOLOGY - NON PAP Ardis Hughs, MD 01/20/2022 1907    Disposition of specimens: Permanent Path  Intraoperative findings:  - Unremarkable urethra and bladder on cystourethroscopy. Bilateral orthotopic Uos - Bilateral RPGs unremarkable without hydroureteronephrosis bilaterally or filling defects bilaterally. Right RPG specifically without filling defect noted in renal pelvis as seen on CT - Right ureteroscopy notable for tight UO requiring balloon dilation. Unremarkable right ureter. Right pyeloscopy notable for velvety mucosal patches in inferior calyx and interpolar calyx intermixed with clot and a papillary appearing growth. Biopsy and cytology obtained and sent - Right 6Fr x 24cm JJ stent with string placed under fluoroscopy  Indication: Shannon Carr is a 56 yo female with a PMH of gross hematuria who presents today for scheduled outpatient cystoscopy, ureteroscopy, and possible right ureteral biopsy for renal pelvis filling defect noted on CT scan. After discussing the risks, benefits of the procedures, including but not limited to bleeding, infection, and damage to adjacent structures, she elected to proceed.   Description of procedure:  After reviewing and obtaining informed consent, the patient was brought to the operating room and placed on the operating table. A time-out was performed and  anesthesia was induced via general LMA. The patient was placed in the dorsal lithotomy position and prepped and draped in the usual standard fashion. A second time-out was performed.  We began by introducing a 21 Fr rigid cystoscope per urethra with copious lubrication with irrigation running. Inspection of the bladder was precluded by grossly bloody urine. The bladder was filled and drained through the scope which provided clear visualization. We performed cystourethroscopy, which was unremarkable without evidence of bladder mass, stones, or lesions. The bilateral Uos were orthotopic in position.   We turned our attention to the left UO. The left UO was cannulated with the 5Fr open-ended catheter and a left RPG was obtained using spot fluoroscopy, with results unremarkable as above. We then turned our attention to the right UO. This was cannulated with the 5Fr open-ended and we obtained a right RPG using spot fluoroscopy, with findings as noted above. We then passed a sensorwire into the renal pelvis under fluoroscopy. The cystoscope was removed and we transitioned to flexible ureteroscopy. The ureteroscope was passed into the bladder using copious lubrication and irrigation. We identified the right UO and attempted to pass into the right distal, but met resistance. We attempted to pass a second sensorwire into the ureter and ride over the adjacent sensorwires but met similar resistance.  At this point, we obtained a 4in x 15Fr balloon ureteral dilator. This was advanced into the distal right ureter under fluoroscopy and the UO and ureter was dilated. We were then able to advance the ureteroscope into the distal ureter without difficulty. We performed ureteroscopy which was unremarkable and without evidence of lesions or masses. We then reached the renal pelvis. There was evidence of diffuse velvety mucosal patches in inferior calyx and interpolar calyx intermixed with clot and a papillary appearing  growth.  We obtained both biopsy and cytology specimens and sent for permanent pathology. Hemostasis was adequate.  At this point, we removed our flexible ureteroscope leaving the sensorwire in place. A 6Fr x 24cm JJ stent on a string was advanced into the renal pelvis under fluoroscopy and removal of the wire revealed appropriate proximal and distal curls. The bladder was emptied and the patient was awakened from anesthesia having tolerated the procedure well.

## 2022-01-20 NOTE — Interval H&P Note (Signed)
History and Physical Interval Note:  01/20/2022 5:46 PM  Shannon Carr  has presented today for surgery, with the diagnosis of right ureteral obstruction.  The various methods of treatment have been discussed with the patient and family. After consideration of risks, benefits and other options for treatment, the patient has consented to  Procedure(s): CYSTOSCOPY WITH bilateral RETROGRADE PYELOGRAM, URETEROSCOPY AND STENT PLACEMENT possible right ureteral biopsy (Right) as a surgical intervention.  The patient's history has been reviewed, patient examined, no change in status, stable for surgery.  I have reviewed the patient's chart and labs.  Questions were answered to the patient's satisfaction.     Ardis Hughs

## 2022-01-20 NOTE — H&P (Signed)
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 She is having gross hematuria.   The patient first noted that she had severe fullness and bloating in her suprapubic region during a long road trip [place] to the beach. When she finally was able to void she had significant gross hematuria. She had a work-up and CT scan that was largely unremarkable with some question of a 1.8 cm right lower pole renal mass. She has had intermittent right sided renal colic and ongoing gross hematuria. She had a follow-up CT scan, hematuria protocol yesterday. The final read has not been completed yet. She otherwise denies any voiding symptoms. She denies any fevers or chills. She has no history of kidney stones. She is otherwise very healthy, takes almost no medication.   She has no family history of cancer. She is a non-smoker.     ALLERGIES: None   MEDICATIONS: Finasteride     GU PSH: None   NON-GU PSH: Laparoscopy, Surgical; Repair Umbilical Hernia, 3295     GU PMH: None   NON-GU PMH: None   FAMILY HISTORY: 3 Son's - Son melanoma - Father   SOCIAL HISTORY: Marital Status: Married Preferred Language: English; Ethnicity: Not Hispanic Or Latino; Race: White Current Smoking Status: Patient has never smoked.   Tobacco Use Assessment Completed: Used Tobacco in last 30 days? Social Drinker.  Drinks 3 caffeinated drinks per day. 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    REVIEW OF SYSTEMS:    GU Review Female:   Patient reports hard to postpone urination, get up at night to urinate, leakage of urine, and trouble starting your stream. Patient denies frequent urination, burning /pain with urination, stream starts and stops, have to strain to urinate, and being pregnant.  Gastrointestinal (Upper):   Patient denies nausea, vomiting, and indigestion/ heartburn.  Gastrointestinal (Lower):   Patient denies diarrhea and constipation.  Constitutional:   Patient denies fever, night sweats, weight loss,  and fatigue.  Skin:   Patient denies skin rash/ lesion and itching.  Eyes:   Patient denies blurred vision and double vision.  Ears/ Nose/ Throat:   Patient denies sore throat and sinus problems.  Hematologic/Lymphatic:   Patient denies swollen glands and easy bruising.  Cardiovascular:   Patient denies leg swelling and chest pains.  Respiratory:   Patient denies cough and shortness of breath.  Endocrine:   Patient denies excessive thirst.  Musculoskeletal:   Patient denies back pain and joint pain.  Neurological:   Patient denies headaches and dizziness.  Psychologic:   Patient denies depression and anxiety.   VITAL SIGNS:      01/20/2022 08:42 AM  Weight 138 lb / 62.6 kg  Height 67 in / 170.18 cm  BP 113/80 mmHg  Pulse 77 /min  BMI 21.6 kg/m   MULTI-SYSTEM PHYSICAL EXAMINATION:    Constitutional: Well-nourished. No physical deformities. Normally developed. Good grooming.  Neck: Neck symmetrical, not swollen. Normal tracheal position.  Respiratory: Normal breath sounds. No labored breathing, no use of accessory muscles.   Cardiovascular: Regular rate and rhythm. No murmur, no gallop. Normal temperature, normal extremity pulses, no swelling, no varicosities.   Lymphatic: No enlargement of neck, axillae, groin.  Skin: No paleness, no jaundice, no cyanosis. No lesion, no ulcer, no rash.  Neurologic / Psychiatric: Oriented to time, oriented to place, oriented to person. No depression, no anxiety, no agitation.  Gastrointestinal: No mass, no tenderness, no rigidity, non obese abdomen.  Eyes: Normal conjunctivae. Normal eyelids.  Ears, Nose, Mouth,  and Throat: Left ear no scars, no lesions, no masses. Right ear no scars, no lesions, no masses. Nose no scars, no lesions, no masses. Normal hearing. Normal lips.  Musculoskeletal: Normal gait and station of head and neck.     Complexity of Data:  Source Of History:  Patient  Records Review:   Previous Doctor Records, Previous Patient  Records  Urine Test Review:   Urinalysis  X-Ray Review: C.T. Abdomen/Pelvis: Reviewed Films. Discussed With Patient.     PROCEDURES:          Urinalysis w/Scope Dipstick Dipstick Cont'd Micro  Color: Red Bilirubin: Invalid mg/dL WBC/hpf: 0 - 5/hpf  Appearance: Cloudy Ketones: Invalid mg/dL RBC/hpf: >60/hpf  Specific Gravity: Invalid Blood: Invalid ery/uL Bacteria: Few (10-25/hpf)  pH: Invalid Protein: Invalid mg/dL Cystals: NS (Not Seen)  Glucose: Invalid mg/dL Urobilinogen: Invalid mg/dL Casts: NS (Not Seen)    Nitrites: Invalid Trichomonas: Not Present    Leukocyte Esterase: Invalid leu/uL Mucous: Not Present      Epithelial Cells: NS (Not Seen)      Yeast: NS (Not Seen)      Sperm: Not Present    Notes: Micro performed on unspun urine    ASSESSMENT:      ICD-10 Details  1 GU:   Gross hematuria - R31.0    PLAN:           Document Letter(s):  Created for Patient: Clinical Summary         Notes:   The patient has gross hematuria with filling defects in the right kidney. She may have clot up in the right renal pelvis with a renal infarction or this may be a transitional cell lesion that is now bleeding. Either way, we spoke about treatment options and I recommended that we proceed to the operating room for ureteroscopy and potential biopsy. I went through the surgery with her in detail. She understands that she will need a stent following the procedure, and was willing to proceed. We will get this scheduled urgently.

## 2022-01-20 NOTE — Anesthesia Procedure Notes (Signed)
Procedure Name: LMA Insertion Date/Time: 01/20/2022 6:17 PM  Performed by: Montel Clock, CRNAPre-anesthesia Checklist: Patient identified, Emergency Drugs available, Suction available, Patient being monitored and Timeout performed Patient Re-evaluated:Patient Re-evaluated prior to induction Oxygen Delivery Method: Circle system utilized Preoxygenation: Pre-oxygenation with 100% oxygen Induction Type: IV induction Ventilation: Mask ventilation without difficulty LMA: LMA with gastric port inserted LMA Size: 4.0 Number of attempts: 1 Dental Injury: Teeth and Oropharynx as per pre-operative assessment

## 2022-01-20 NOTE — Anesthesia Preprocedure Evaluation (Addendum)
Anesthesia Evaluation  Patient identified by MRN, date of birth, ID band Patient awake    Reviewed: Allergy & Precautions, NPO status , Patient's Chart, lab work & pertinent test results  Airway Mallampati: II  TM Distance: >3 FB Neck ROM: Full    Dental no notable dental hx. (+) Teeth Intact, Dental Advisory Given   Pulmonary    Pulmonary exam normal breath sounds clear to auscultation       Cardiovascular Exercise Tolerance: Good Normal cardiovascular exam Rhythm:Regular Rate:Normal     Neuro/Psych    GI/Hepatic   Endo/Other  negative endocrine ROS  Renal/GU Renal diseaseLab Results      Component                Value               Date                      CREATININE               0.87                08/04/2021               K                        4.5                 08/04/2021                  Musculoskeletal   Abdominal   Peds  Hematology Lab Results      Component                Value               Date                      WBC                      7.7                 01/20/2022                HGB                      13.6                01/20/2022                HCT                      40.6                01/20/2022                MCV                      94.4                01/20/2022                PLT                      256                 01/20/2022  Anesthesia Other Findings   Reproductive/Obstetrics                            Anesthesia Physical Anesthesia Plan  ASA: 1 and emergent  Anesthesia Plan: General   Post-op Pain Management: Tylenol PO (pre-op)*   Induction: Intravenous  PONV Risk Score and Plan: Treatment may vary due to age or medical condition, Midazolam, Ondansetron and Dexamethasone  Airway Management Planned: LMA  Additional Equipment: None  Intra-op Plan:   Post-operative Plan:   Informed Consent: I have reviewed the patients  History and Physical, chart, labs and discussed the procedure including the risks, benefits and alternatives for the proposed anesthesia with the patient or authorized representative who has indicated his/her understanding and acceptance.     Dental advisory given  Plan Discussed with: CRNA and Anesthesiologist  Anesthesia Plan Comments:        Anesthesia Quick Evaluation

## 2022-01-21 ENCOUNTER — Encounter (HOSPITAL_COMMUNITY): Payer: Self-pay | Admitting: Urology

## 2022-01-21 NOTE — Progress Notes (Signed)
Hi Yahayra, they finally resulted your CT.  It looks like you had a biopsy done.  I am hoping the results will come back in the next couple days and will have a sense of what is going on.  I was out of town so I know you ended up seeing Jade.  I am sorry they were able to get you in sooner with the referrals that I had placed before left.  Hopefully you are resting and feeling a little bit better.  Luvenia Starch had mentioned that you are in a lot of discomfort and pain.  So glad they were able to work you in.

## 2022-01-22 ENCOUNTER — Telehealth: Payer: BC Managed Care – PPO | Admitting: Family Medicine

## 2022-01-22 ENCOUNTER — Ambulatory Visit: Payer: BC Managed Care – PPO | Admitting: Family Medicine

## 2022-01-22 LAB — URINALYSIS, MICROSCOPIC ONLY: Hyaline Cast: NONE SEEN /LPF

## 2022-01-22 LAB — URINE CULTURE
MICRO NUMBER:: 14122043
SPECIMEN QUALITY:: ADEQUATE

## 2022-01-22 NOTE — Progress Notes (Signed)
Nizhoni's culture just came back with low colony count but some enterococcus. Thoughts on treating or routine to urology?

## 2022-01-23 ENCOUNTER — Emergency Department (HOSPITAL_COMMUNITY)
Admission: EM | Admit: 2022-01-23 | Discharge: 2022-01-23 | Disposition: A | Discharge status: Home / Self Care | Payer: BC Managed Care – PPO | Attending: Emergency Medicine | Admitting: Emergency Medicine

## 2022-01-23 ENCOUNTER — Emergency Department (HOSPITAL_COMMUNITY): Payer: BC Managed Care – PPO

## 2022-01-23 ENCOUNTER — Other Ambulatory Visit: Payer: Self-pay

## 2022-01-23 ENCOUNTER — Other Ambulatory Visit: Payer: Self-pay | Admitting: Family Medicine

## 2022-01-23 DIAGNOSIS — N2889 Other specified disorders of kidney and ureter: Secondary | ICD-10-CM | POA: Diagnosis not present

## 2022-01-23 DIAGNOSIS — R310 Gross hematuria: Secondary | ICD-10-CM | POA: Diagnosis not present

## 2022-01-23 DIAGNOSIS — R1110 Vomiting, unspecified: Secondary | ICD-10-CM | POA: Diagnosis not present

## 2022-01-23 DIAGNOSIS — R109 Unspecified abdominal pain: Secondary | ICD-10-CM | POA: Diagnosis not present

## 2022-01-23 DIAGNOSIS — N135 Crossing vessel and stricture of ureter without hydronephrosis: Secondary | ICD-10-CM

## 2022-01-23 DIAGNOSIS — D49511 Neoplasm of unspecified behavior of right kidney: Secondary | ICD-10-CM | POA: Diagnosis not present

## 2022-01-23 DIAGNOSIS — E876 Hypokalemia: Secondary | ICD-10-CM | POA: Insufficient documentation

## 2022-01-23 DIAGNOSIS — R9431 Abnormal electrocardiogram [ECG] [EKG]: Secondary | ICD-10-CM | POA: Diagnosis not present

## 2022-01-23 DIAGNOSIS — N134 Hydroureter: Secondary | ICD-10-CM | POA: Diagnosis not present

## 2022-01-23 DIAGNOSIS — N138 Other obstructive and reflux uropathy: Secondary | ICD-10-CM | POA: Diagnosis not present

## 2022-01-23 DIAGNOSIS — N1330 Unspecified hydronephrosis: Secondary | ICD-10-CM | POA: Diagnosis not present

## 2022-01-23 LAB — URINALYSIS, ROUTINE W REFLEX MICROSCOPIC
Bacteria, UA: NONE SEEN
Bilirubin Urine: NEGATIVE
Glucose, UA: NEGATIVE mg/dL
Ketones, ur: 20 mg/dL — AB
Leukocytes,Ua: NEGATIVE
Nitrite: POSITIVE — AB
Protein, ur: 100 mg/dL — AB
RBC / HPF: >50 RBC/hpf — ABNORMAL HIGH (ref 0–5)
Specific Gravity, Urine: 1.018 (ref 1.005–1.030)
pH: 6 (ref 5.0–8.0)

## 2022-01-23 LAB — COMPREHENSIVE METABOLIC PANEL
ALT: 15 U/L (ref 0–44)
AST: 21 U/L (ref 15–41)
Albumin: 5 g/dL (ref 3.5–5.0)
Alkaline Phosphatase: 47 U/L (ref 38–126)
Anion gap: 18 — ABNORMAL HIGH (ref 5–15)
BUN: 13 mg/dL (ref 6–20)
CO2: 20 mmol/L — ABNORMAL LOW (ref 22–32)
Calcium: 9.3 mg/dL (ref 8.9–10.3)
Chloride: 96 mmol/L — ABNORMAL LOW (ref 98–111)
Creatinine, Ser: 0.74 mg/dL (ref 0.44–1.00)
GFR, Estimated: >60 mL/min (ref >60)
Glucose, Bld: 134 mg/dL — ABNORMAL HIGH (ref 70–99)
Potassium: 3.2 mmol/L — ABNORMAL LOW (ref 3.5–5.1)
Sodium: 134 mmol/L — ABNORMAL LOW (ref 135–145)
Total Bilirubin: 0.9 mg/dL (ref 0.3–1.2)
Total Protein: 7.8 g/dL (ref 6.5–8.1)

## 2022-01-23 LAB — LIPASE, BLOOD: Lipase: 49 U/L (ref 11–51)

## 2022-01-23 LAB — SURGICAL PATHOLOGY

## 2022-01-23 LAB — CBC WITH DIFFERENTIAL/PLATELET
Abs Immature Granulocytes: 0.04 10*3/uL (ref 0.00–0.07)
Basophils Absolute: 0 10*3/uL (ref 0.0–0.1)
Basophils Relative: 0 %
Eosinophils Absolute: 0.1 10*3/uL (ref 0.0–0.5)
Eosinophils Relative: 1 %
HCT: 39.8 % (ref 36.0–46.0)
Hemoglobin: 14 g/dL (ref 12.0–15.0)
Immature Granulocytes: 0 %
Lymphocytes Relative: 36 %
Lymphs Abs: 4.2 10*3/uL — ABNORMAL HIGH (ref 0.7–4.0)
MCH: 31.6 pg (ref 26.0–34.0)
MCHC: 35.2 g/dL (ref 30.0–36.0)
MCV: 89.8 fL (ref 80.0–100.0)
Monocytes Absolute: 1 10*3/uL (ref 0.1–1.0)
Monocytes Relative: 9 %
Neutro Abs: 6.1 10*3/uL (ref 1.7–7.7)
Neutrophils Relative %: 54 %
Platelets: 263 10*3/uL (ref 150–400)
RBC: 4.43 MIL/uL (ref 3.87–5.11)
RDW: 11.9 % (ref 11.5–15.5)
WBC: 11.5 10*3/uL — ABNORMAL HIGH (ref 4.0–10.5)
nRBC: 0 % (ref 0.0–0.2)

## 2022-01-23 LAB — LACTIC ACID, PLASMA
Lactic Acid, Venous: 2.3 mmol/L (ref 0.5–1.9)
Lactic Acid, Venous: 0.8 mmol/L (ref 0.5–1.9)

## 2022-01-23 LAB — CYTOLOGY - NON PAP

## 2022-01-23 MED ORDER — KETOROLAC TROMETHAMINE 15 MG/ML IJ SOLN
15.0000 mg | Freq: Once | INTRAMUSCULAR | Status: AC
  Administered 2022-01-23: 15 mg via INTRAVENOUS
  Filled 2022-01-23: qty 1

## 2022-01-23 MED ORDER — SODIUM CHLORIDE 0.9 % IV SOLN
1.0000 g | Freq: Once | INTRAVENOUS | Status: DC
  Filled 2022-01-23: qty 10

## 2022-01-23 MED ORDER — CIPROFLOXACIN HCL 500 MG PO TABS: 500.0000 mg | ORAL_TABLET | Freq: Two times a day (BID) | ORAL | 0 refills | Status: AC

## 2022-01-23 MED ORDER — IOHEXOL 300 MG/ML  SOLN
100.0000 mL | Freq: Once | INTRAMUSCULAR | Status: DC | PRN
  Administered 2022-01-23: 100 mL via INTRAVENOUS

## 2022-01-23 MED ORDER — KETOROLAC TROMETHAMINE 10 MG PO TABS: 10.0000 mg | ORAL_TABLET | Freq: Four times a day (QID) | ORAL | 0 refills | Status: AC | PRN

## 2022-01-23 MED ORDER — LACTATED RINGERS IV BOLUS
1000.0000 mL | Freq: Once | INTRAVENOUS | Status: AC
  Administered 2022-01-23: 1000 mL via INTRAVENOUS

## 2022-01-23 MED ORDER — ONDANSETRON HCL 4 MG/2ML IJ SOLN
4.0000 mg | Freq: Once | INTRAMUSCULAR | Status: AC
  Administered 2022-01-23: 4 mg via INTRAVENOUS
  Filled 2022-01-23: qty 2

## 2022-01-23 MED ORDER — NITROFURANTOIN MONOHYD MACRO 100 MG PO CAPS: 100.0000 mg | ORAL_CAPSULE | Freq: Two times a day (BID) | ORAL | 0 refills | Status: DC

## 2022-01-23 MED ORDER — POTASSIUM CHLORIDE CRYS ER 20 MEQ PO TBCR
40.0000 meq | EXTENDED_RELEASE_TABLET | Freq: Once | ORAL | Status: AC
  Administered 2022-01-23: 40 meq via ORAL
  Filled 2022-01-23: qty 2

## 2022-01-23 MED ORDER — HYDROMORPHONE HCL 1 MG/ML IJ SOLN
0.5000 mg | INTRAMUSCULAR | Status: DC | PRN
  Administered 2022-01-23: 0.5 mg via INTRAVENOUS
  Filled 2022-01-23: qty 1

## 2022-01-23 MED ORDER — HYDROMORPHONE HCL 1 MG/ML IJ SOLN
0.5000 mg | Freq: Once | INTRAMUSCULAR | Status: AC
  Administered 2022-01-23: 0.5 mg via INTRAVENOUS
  Filled 2022-01-23: qty 1

## 2022-01-23 MED ORDER — ONDANSETRON 4 MG PO TBDP: 4.0000 mg | ORAL_TABLET | Freq: Once | ORAL | Status: DC

## 2022-01-23 NOTE — ED Notes (Addendum)
Lactic 2.3, attempted to notify EDP Mayra Neer MD via phone call, no response.  She was not in the office. Unable to locate. Note left. IVF infusing.

## 2022-01-23 NOTE — Discharge Instructions (Addendum)
Thank you for coming to [facility] Emergency Department. You were seen for severe right lower quadrant pain. We did an exam, labs, and imaging, and these showed your known right renal mass as well as a probable blood clot obstruction of your ureter.  Your pain acutely improved and this likely pass on its own.  You were evaluated by urology who recommended Toradol and following up in clinic as originally scheduled.  Please take your Macrobid as originally planned. You have been prescribed toradol 10 mg every 6 hours as needed for pain x 3 days.  Do not hesitate to return to the ED or call 911 if you experience: -Worsening symptoms -Inability to urinate -Lightheadedness, passing out -Fevers/chills -Anything else that concerns you

## 2022-01-23 NOTE — ED Provider Triage Note (Signed)
Emergency Medicine Provider Triage Evaluation Note  Shannon Carr , a 56 y.o. female  was evaluated in triage.  Pt complains of RLQ abdominal pain that has been ongoing for several days and worsened today. Still has her gallbladder and appendix. Notes that she had a cystoscopy with stent placement on 01/20/22. Pt removed the stent today at 12:45 PM as instructed and began to have worsening pain. No meds tried PTA. NKDA. Has had hematuria since the procedure. Feels lightheaded at this time. Denies chest pain, shortness of breath.   Review of Systems  Positive:  Negative:   Physical Exam  LMP 06/19/2019 (Approximate)  Gen:   Awake, uncomfortable appearing. Resp:  Normal effort  MSK:   Moves extremities without difficulty  Other:  Suprapubic and RLQ TTP.   Medical Decision Making  Medically screening exam initiated at 3:10 PM.  Appropriate orders placed.  Shannon Carr was informed that the remainder of the evaluation will be completed by another provider, this initial triage assessment does not replace that evaluation, and the importance of remaining in the ED until their evaluation is complete.  3:12 PM - Discussed with RN that patient is in need of a room. RN aware and working on room placement.    Travis Purk A, PA-C 01/23/22 1512

## 2022-01-23 NOTE — Consult Note (Signed)
Urology Consult   Physician requesting consult: Audley Hose, MD  Reason for consult: flank pain, hydronephrosis  History of Present Illness: Analycia Carr is a 56 y.o. with a PMH of right upper tract filling defect now POD#3 s/p diagnostic ureteroscopy, right renal pelvis biopsy, and ureteral stent placement who presented to the ED with right flank and right groin/lower quadrant pain after ureteral stent removal at home. Patient reports she removed her stent around noon. Over the course of an hour, pain gradually set on in her right groin and right flank. She reports the pain became more intense than labor pains. She reports associated nausea/vomiting. She denies fevers, chills, or difficulty urinating. She denies dysuria. She has been taking pyridium since discharge. She also started a course of macrobid today for a positive pre-operative culture.  In the ED, patient reportedly "passed out" in the waiting room secondary to pain. She was given IV dilaudid x2 with good effect, bringing her pain down to a 1/10 at the time of my evaluation. She underwent a CT scan that demonstrates right hydroureteronephrosis from the level of the UVJ with associated soft tissue density at the UVJ, possibly a blood clot.    Past Medical History:  Diagnosis Date   Hx of adenomatous colonic polyps 02/27/2016   Ovarian cyst     Past Surgical History:  Procedure Laterality Date   ANTERIOR AND POSTERIOR REPAIR  12/2012   COLONOSCOPY  2017   CYSTOSCOPY WITH RETROGRADE PYELOGRAM, URETEROSCOPY AND STENT PLACEMENT Right 01/20/2022   Procedure: CYSTOSCOPY WITH bilateral RETROGRADE PYELOGRAM, URETEROSCOPY AND STENT PLACEMENT, right ureteral biopsy;  Surgeon: Ardis Hughs, MD;  Location: [facility];  Service: Urology;  Laterality: Right;   TONSILLECTOMY AND ADENOIDECTOMY       Current Hospital Medications:  Home meds:  No current facility-administered medications on file prior to encounter.   Current Outpatient  Medications on File Prior to Encounter  Medication Sig Dispense Refill   ciprofloxacin (CIPRO) 500 MG tablet Take 1 tablet (500 mg total) by mouth 2 (two) times daily for 5 days. 10 tablet 0   acyclovir (ZOVIRAX) 400 MG tablet Take 1 tablet (400 mg total) by mouth daily as needed. 90 tablet 1   albuterol (VENTOLIN HFA) 108 (90 Base) MCG/ACT inhaler Inhale 1-2 puffs into the lungs every 6 (six) hours as needed for wheezing or shortness of breath. 18 g prn   finasteride (PROPECIA) 1 MG tablet Take 1 tablet (1 mg total) by mouth daily. 90 tablet 0   nitrofurantoin, macrocrystal-monohydrate, (MACROBID) 100 MG capsule Take 1 capsule (100 mg total) by mouth 2 (two) times daily. 10 capsule 0   phenazopyridine (PYRIDIUM) 200 MG tablet Take 1 tablet (200 mg total) by mouth 3 (three) times daily as needed for pain. 10 tablet 0   tamsulosin (FLOMAX) 0.4 MG CAPS capsule Take 1 capsule (0.4 mg total) by mouth daily. 7 capsule 0   traMADol (ULTRAM) 50 MG tablet Take 1 tablet (50 mg total) by mouth every 6 (six) hours as needed for up to 5 days. 21 tablet 0     Scheduled Meds: Continuous Infusions: PRN Meds:.HYDROmorphone (DILAUDID) injection  Allergies: No Known Allergies  Family History  Problem Relation Age of Onset   Melanoma Father    Colon cancer Maternal Aunt 72   Colon cancer Other        Elderly    Social History:  reports that she has never smoked. She has never used smokeless tobacco. She reports current  alcohol use. She reports that she does not use drugs.  ROS: A complete review of systems was performed.  All systems are negative except for pertinent findings as noted.  Physical Exam:  Vital signs in last 24 hours: Temp:  [97.6 F (36.4 C)] 97.6 F (36.4 C) (11/03 1932) Pulse Rate:  [66-84] 66 (11/03 1930) Resp:  [12-20] 15 (11/03 1930) BP: (101-178)/(66-96) 114/71 (11/03 1930) SpO2:  [94 %-100 %] 96 % (11/03 1930) Constitutional:  Alert and oriented, No acute  distress Cardiovascular: Regular rate and rhythm, No JVD Respiratory: Normal respiratory effort, Lungs clear bilaterally GI: Abdomen is soft, nontender, nondistended, no abdominal masses GU: No CVA tenderness bilaterally Lymphatic: No lymphadenopathy Neurologic: Grossly intact, no focal deficits Psychiatric: Normal mood and affect  Laboratory Data:  Recent Labs    01/23/22 1506  WBC 11.5*  HGB 14.0  HCT 39.8  PLT 263    Recent Labs    01/23/22 1506  NA 134*  K 3.2*  CL 96*  GLUCOSE 134*  BUN 13  CALCIUM 9.3  CREATININE 0.74     Results for orders placed or performed during the hospital encounter of 01/23/22 (from the past 24 hour(s))  CBC with Differential     Status: Abnormal   Collection Time: 01/23/22  3:06 PM  Result Value Ref Range   WBC 11.5 (H) 4.0 - 10.5 K/uL   RBC 4.43 3.87 - 5.11 MIL/uL   Hemoglobin 14.0 12.0 - 15.0 g/dL   HCT 39.8 36.0 - 46.0 %   MCV 89.8 80.0 - 100.0 fL   MCH 31.6 26.0 - 34.0 pg   MCHC 35.2 30.0 - 36.0 g/dL   RDW 11.9 11.5 - 15.5 %   Platelets 263 150 - 400 K/uL   nRBC 0.0 0.0 - 0.2 %   Neutrophils Relative % 54 %   Neutro Abs 6.1 1.7 - 7.7 K/uL   Lymphocytes Relative 36 %   Lymphs Abs 4.2 (H) 0.7 - 4.0 K/uL   Monocytes Relative 9 %   Monocytes Absolute 1.0 0.1 - 1.0 K/uL   Eosinophils Relative 1 %   Eosinophils Absolute 0.1 0.0 - 0.5 K/uL   Basophils Relative 0 %   Basophils Absolute 0.0 0.0 - 0.1 K/uL   Immature Granulocytes 0 %   Abs Immature Granulocytes 0.04 0.00 - 0.07 K/uL  Comprehensive metabolic panel     Status: Abnormal   Collection Time: 01/23/22  3:06 PM  Result Value Ref Range   Sodium 134 (L) 135 - 145 mmol/L   Potassium 3.2 (L) 3.5 - 5.1 mmol/L   Chloride 96 (L) 98 - 111 mmol/L   CO2 20 (L) 22 - 32 mmol/L   Glucose, Bld 134 (H) 70 - 99 mg/dL   BUN 13 6 - 20 mg/dL   Creatinine, Ser 0.74 0.44 - 1.00 mg/dL   Calcium 9.3 8.9 - 10.3 mg/dL   Total Protein 7.8 6.5 - 8.1 g/dL   Albumin 5.0 3.5 - 5.0 g/dL   AST  21 15 - 41 U/L   ALT 15 0 - 44 U/L   Alkaline Phosphatase 47 38 - 126 U/L   Total Bilirubin 0.9 0.3 - 1.2 mg/dL   GFR, Estimated >60 >60 mL/min   Anion gap 18 (H) 5 - 15  Lipase, blood     Status: None   Collection Time: 01/23/22  3:06 PM  Result Value Ref Range   Lipase 49 11 - 51 U/L  Lactic acid, plasma  Status: Abnormal   Collection Time: 01/23/22  3:21 PM  Result Value Ref Range   Lactic Acid, Venous 2.3 (HH) 0.5 - 1.9 mmol/L  Lactic acid, plasma     Status: None   Collection Time: 01/23/22  5:47 PM  Result Value Ref Range   Lactic Acid, Venous 0.8 0.5 - 1.9 mmol/L  Urinalysis, Routine w reflex microscopic Urine, Clean Catch     Status: Abnormal   Collection Time: 01/23/22  5:55 PM  Result Value Ref Range   Color, Urine AMBER (A) YELLOW   APPearance CLEAR CLEAR   Specific Gravity, Urine 1.018 1.005 - 1.030   pH 6.0 5.0 - 8.0   Glucose, UA NEGATIVE NEGATIVE mg/dL   Hgb urine dipstick LARGE (A) NEGATIVE   Bilirubin Urine NEGATIVE NEGATIVE   Ketones, ur 20 (A) NEGATIVE mg/dL   Protein, ur 100 (A) NEGATIVE mg/dL   Nitrite POSITIVE (A) NEGATIVE   Leukocytes,Ua NEGATIVE NEGATIVE   RBC / HPF >50 (H) 0 - 5 RBC/hpf   WBC, UA 21-50 0 - 5 WBC/hpf   Bacteria, UA NONE SEEN NONE SEEN   Squamous Epithelial / LPF 0-5 0 - 5   Recent Results (from the past 240 hour(s))  Urine Culture     Status: Abnormal   Collection Time: 01/19/22  2:22 PM   Specimen: Urine  Result Value Ref Range Status   MICRO NUMBER: [id number]  Final   SPECIMEN QUALITY: Adequate  Final   Sample Source URINE  Final   STATUS: FINAL  Final   ISOLATE 1: Enterococcus faecalis (A)  Final    Comment: 10,000-49,000 CFU/mL of Enterococcus faecalis      Susceptibility   Enterococcus faecalis - URINE CULTURE POSITIVE 1    AMPICILLIN <=2 Sensitive     VANCOMYCIN 1 Sensitive     NITROFURANTOIN* <=16 Sensitive      * Legend: S = Susceptible  I = Intermediate R = Resistant  NS = Not susceptible * = Not tested  NR =  Not reported **NN = See antimicrobic comments     Renal Function: Recent Labs    01/20/22 1512 01/23/22 1506  CREATININE 0.94 0.74   Estimated Creatinine Clearance: 73.5 mL/min (by C-G formula based on SCr of 0.74 mg/dL).  Radiologic Imaging: CT ABDOMEN PELVIS W CONTRAST  Result Date: 01/23/2022 CLINICAL DATA:  Pain right lower quadrant, nausea, vomiting EXAM: CT ABDOMEN AND PELVIS WITH CONTRAST TECHNIQUE: Multidetector CT imaging of the abdomen and pelvis was performed using the standard protocol following bolus administration of intravenous contrast. RADIATION DOSE REDUCTION: This exam was performed according to the departmental dose-optimization program which includes automated exposure control, adjustment of the mA and/or kV according to patient size and/or use of iterative reconstruction technique. CONTRAST:  19m OMNIPAQUE IOHEXOL 300 MG/ML  SOLN COMPARISON:  CT abdomen done on 01/19/2022 FINDINGS: Lower chest: New linear densities seen in posterior right lower lung field suggesting atelectasis. There is no pleural effusion. Hepatobiliary: There is fatty infiltration in liver. There is no dilation of bile ducts. Gallbladder is unremarkable. Pancreas: There is slight prominence of pancreatic duct. No focal abnormalities are seen. Spleen: Unremarkable. Adrenals/Urinary Tract: Adrenals are not enlarged. There is no hydronephrosis in the left kidney. There is moderate to marked right hydronephrosis. There is marked dilation of right ureter down to the ureterovesical junction. There is 1.5 cm high density structure at the right ureterovesical junction. There are no discrete renal or ureteral stones. There are pockets of air in the  lumen of urinary bladder, possibly due to recent instrumentation. Stomach/Bowel: Stomach is unremarkable. Small bowel loops are not dilated. Appendix is not dilated. There is no significant wall thickening in colon. There is no pericolic stranding. Vascular/Lymphatic:  Unremarkable. Reproductive: There is inhomogeneous enhancement in myometrium, possibly suggesting small fibroids. Other: There is no ascites or pneumoperitoneum. Musculoskeletal: No acute findings are seen. IMPRESSION: There is moderate to severe right hydronephrosis and right hydroureter. There is 1.5 cm soft tissue density at the right ureterovesical junction. Findings suggest obstruction at the right ureterovesical junction caused by benign or malignant stricture or blood clot. There is no demonstrable opaque renal or ureteral stone. Cystoscopy should be considered for further evaluation. There is no evidence of intestinal obstruction or pneumoperitoneum. Appendix is not dilated. Fatty liver. New subsegmental atelectasis is seen in right lower lobe. Other findings as described in the body of the report. Electronically Signed   By: Elmer Picker M.D.   On: 01/23/2022 17:40    I independently reviewed the above imaging studies.  Impression/Recommendation: 56 year old woman now POD#3 s/p diagnostic right ureteroscopy, right renal pelvis biopsy, and right ureteral stent who presented with post-ureteral stent removal ureteral spasms versus transient obstruction related to obstructing blood clot at the UVJ. She reports almost complete resolution of her pain since receiving pain medicines and peeing out some small blood clots. Of note, since her initial labs, her lactic acidosis cleared (0.8 on recheck). She is nontoxic appearing and comfortable without reports of any pain.  I suspect she was experiencing a combination of ureteral spasms from stent removal and transient obstruction from a clot passed from her upper tract 2/2 biopsy. She appears to have since passed the clot or her spasms have resolved, or a combination of both.    We did discuss her pathology results briefly. She was informed there was no evidence of malignancy on both biopsy and cytology. However, she was instructed that Dr. Louis Meckel  would ultimately determine follow-up and work-up related to the abnormality in her renal pelvis. This could include further procedures or imaging. She expressed understanding but she was ultimately very relieved to hear the news of her biopsy results.   - No acute Urologic intervention indicated; okay for discharge home with scheduled outpatient follow-up - IV toradol prior to discharge - Please send home with 3-5 days of PO toradol '15mg'$  q6h prn - Patient instructed to continue tylenol, flomax 0.'4mg'$  qhs - Instructed patient to complete previously prescribed 5 day course of macrobid for E faecalis UTI - Return precautions including fevers, chills, refractory nausea/vomiting, uncontrolled pain; patient and husband expressed understanding  Discussed with Dr. Constance Goltz Chrissi Crow 01/23/2022, 8:09 PM

## 2022-01-23 NOTE — Progress Notes (Signed)
Normally, I wouldn't treat based on low culture count but with significant hematuria will start macrobid. Looks like biopsy is pending.

## 2022-01-23 NOTE — Progress Notes (Signed)
Went to pt's room to do nursing admission history. Urologist states that pt will be going home.  Doroteo Bradford BSN, RN-BC Throughput Nurse 01/23/2022 7:24 PM

## 2022-01-23 NOTE — Progress Notes (Signed)
We can definitely change it.  I sent over 5 days of Cipro.  I am not sure how long he wanted her on it but that should be adequate.  Send over to [facility] in [place].  I hope she is feeling at least some better.

## 2022-01-23 NOTE — ED Provider Notes (Signed)
[facility] DEPT Provider Note   CSN: [id number] Arrival date & time: 01/23/22  1454     History  Chief Complaint  Patient presents with   Emesis    Shannon Carr is a 56 y.o. female with gross hematuria w/ R lower pole renal mass w/ recent cystoscopy and ureteral stent placement who presents with emesis.  Accompanied by husband who provides additional history.  Patient recently has had gross hematuria and was found to have a renal mass.  On 10/31 had a cystoscopy with mass biopsy and stent placement.  Has not felt well since that procedure and has continued to feel worse every day.  With abdominal pain nausea vomiting fatigue anorexia.  Today patient removed the stent at home at 12:45 PM as she was instructed by urology and acutely began to feel much worse with tentative 10 right lower quadrant abdominal pain associated with nausea vomiting, lightheadedness.  Patient states that the pain feels like "colic radiating down through her ureter and making her bladder spasm." Patient was noted to have an episode of loss of consciousness while in a wheelchair waiting in the waiting room. No meds tried PTA. NKDA. Has had hematuria since the procedure but has been able to urinate, last urinated today at about 1 PM after she removed it..  Denies fever/chills, chest pain, shortness of breath.  Has a history of an umbilical hernia repair but no other abdominal surgeries.  Has had some associated constipation possibly due to the meds she was taking at home.  Lr    Emesis      Home Medications Prior to Admission medications   Medication Sig Start Date End Date Taking? Authorizing Provider  ciprofloxacin (CIPRO) 500 MG tablet Take 1 tablet (500 mg total) by mouth 2 (two) times daily for 5 days. 01/23/22 01/28/22  Hali Marry, MD  acyclovir (ZOVIRAX) 400 MG tablet Take 1 tablet (400 mg total) by mouth daily as needed. 03/12/20   Luetta Nutting, DO  albuterol  (VENTOLIN HFA) 108 (90 Base) MCG/ACT inhaler Inhale 1-2 puffs into the lungs every 6 (six) hours as needed for wheezing or shortness of breath. 07/17/19   Hali Marry, MD  finasteride (PROPECIA) 1 MG tablet Take 1 tablet (1 mg total) by mouth daily. 01/19/22   Breeback, Royetta Car, PA-C  nitrofurantoin, macrocrystal-monohydrate, (MACROBID) 100 MG capsule Take 1 capsule (100 mg total) by mouth 2 (two) times daily. 01/23/22   Hali Marry, MD  phenazopyridine (PYRIDIUM) 200 MG tablet Take 1 tablet (200 mg total) by mouth 3 (three) times daily as needed for pain. 01/19/22   Breeback, Royetta Car, PA-C  tamsulosin (FLOMAX) 0.4 MG CAPS capsule Take 1 capsule (0.4 mg total) by mouth daily. 01/20/22   Ardis Hughs, MD  traMADol (ULTRAM) 50 MG tablet Take 1 tablet (50 mg total) by mouth every 6 (six) hours as needed for up to 5 days. 01/19/22 01/24/22  Donella Stade, PA-C      Allergies    Patient has no known allergies.    Review of Systems   Review of Systems  Gastrointestinal:  Positive for vomiting.   Review of systems positive for LOC.  A 10 point review of systems was performed and is negative unless otherwise reported in HPI.  Physical Exam Updated Vital Signs BP (!) 160/73 (BP Location: Right Arm)   Pulse 66   Temp 97.6 F (36.4 C) (Oral)   Resp 18   LMP 06/19/2019 (Approximate)  SpO2 100%  Physical Exam General: Extremely uncomfortable appearing female writhing in the bed, dry heaving, asking to please make the pain stop HEENT: Sclera anicteric, dry mucous membranes, trachea midline. Cardiology: RRR, no murmurs/rubs/gallops. BL radial and DP pulses equal bilaterally.  Resp: Tachypneic rate with normal effort. CTAB, no wheezes, rhonchi, crackles.  Abd: Abdomen is soft with extreme tenderness palpation in the suprapubic, right lower quadrant, right upper quadrant, right CVA regions with guarding. GU: Deferred. MSK: No peripheral edema or signs of trauma. Extremities  without deformity or TTP. No cyanosis or clubbing. Skin: warm, dry. No rashes or lesions. Back +R CVA tenderness Neuro: A&Ox4, CNs II-XII grossly intact. MAEs. Sensation grossly intact.  Psych: In pain, tired appearing  ED Results / Procedures / Treatments   Labs (all labs ordered are listed, but only abnormal results are displayed) Labs Reviewed  CBC WITH DIFFERENTIAL/PLATELET  COMPREHENSIVE METABOLIC PANEL  LIPASE, BLOOD  URINALYSIS, ROUTINE W REFLEX MICROSCOPIC  LACTIC ACID, PLASMA  LACTIC ACID, PLASMA  CBG MONITORING, ED    EKG EKG Interpretation  Date/Time:  Friday January 23 2022 15:54:51 EDT Ventricular Rate:  65 PR Interval:  159 QRS Duration: 84 QT Interval:  442 QTC Calculation: 460 R Axis:   76 Text Interpretation: Sinus rhythm Confirmed by Pattricia Boss [phone]) on 01/24/2022 6:19:32 PM  Radiology  CT abd pelvis w/ contrast: There is moderate to severe right hydronephrosis and right hydroureter. There is 1.5 cm soft tissue density at the right ureterovesical junction. Findings suggest obstruction at the right ureterovesical junction caused by benign or malignant stricture or blood clot. There is no demonstrable opaque renal or ureteral stone. Cystoscopy should be considered for further evaluation.   There is no evidence of intestinal obstruction or pneumoperitoneum. Appendix is not dilated.   Fatty liver. New subsegmental atelectasis is seen in right lower lobe.   Procedures Procedures    Medications Ordered in ED Medications  ondansetron (ZOFRAN) injection 4 mg (4 mg Intravenous Given 01/23/22 1533)  HYDROmorphone (DILAUDID) injection 0.5 mg (0.5 mg Intravenous Given 01/23/22 1533)  lactated ringers bolus 1,000 mL (0 mLs Intravenous Stopped 01/23/22 1901)  ketorolac (TORADOL) 15 MG/ML injection 15 mg (15 mg Intravenous Given 01/23/22 1936)  potassium chloride SA (KLOR-CON M) CR tablet 40 mEq (40 mEq Oral Given 01/23/22 1936)    ED Course/ Medical  Decision Making/ A&P                          Medical Decision Making Amount and/or Complexity of Data Reviewed Labs: ordered. Decision-making details documented in ED Course. Radiology: ordered. Decision-making details documented in ED Course.  Risk Prescription drug management.    Patient is vitally stable, afebrile, though extremely uncomfortable appearing on exam.  Patient reportedly had an episode of loss consciousness while in the waiting room.  On exam patient appears to be hyperventilating due to pain.  Consider postop complications of the cystoscopy, biopsy, and stent placement including UTI/pyelonephritis, retroperitoneal or intraperitoneal bleeding, acute blood loss anemia, intraperitoneal or retroperitoneal infection, renal abscess, ruptured ureter, urinary obstruction. Patient has reported constipation though abdomen is not distended or tympanitic, low c/f SBO/ileus. Patient has had poor PO intake, consider electrolyte abnormalities, AKI, dehydration.   I have personally reviewed and interpreted all labs and imaging.   Clinical Course as of 02/02/22 2304  Fri Jan 23, 2022  1554 WBC(!): 11.5 Leukocytosis [HN]  1554 Hemoglobin: 14.0 No anemia [HN]  1644 Lactic Acid, Venous(!!): 2.3 [  HN]  1644 Lipase: 49 [HN]  1644 Potassium(!): 3.2  Repleted [HN]  1644 Sodium(!): 134 [HN]  1644 Creatinine: 0.74 [HN]  1644 BUN: 13 [HN]  1752 CT ABDOMEN PELVIS W CONTRAST There is moderate to severe right hydronephrosis and right hydroureter. There is 1.5 cm soft tissue density at the right ureterovesical junction. Findings suggest obstruction at the right ureterovesical junction caused by benign or malignant stricture or blood clot. There is no demonstrable opaque renal or ureteral stone. Cystoscopy should be considered for further evaluation.  There is no evidence of intestinal obstruction or pneumoperitoneum. Appendix is not dilated.  Fatty liver. New subsegmental atelectasis is seen  in right lower lobe.  [HN]  3491 Paged to urology for obstruction at R UVJ [HN]  1911 WBC, UA: 21-50 [HN]  1912 Nitrite(!): POSITIVE C/f infection, will treat with abx [HN]  1913 Lactic Acid, Venous: 0.8 Improving [HN]  1930 D/w urology who evaluated the patient. He stated that patient's pain on his evalution is essentially gone now. He states that she likely had a blood clot obstruction that has since passed. He recommends toradol for a few days and for her to continue her macrobid at home that she is already taking.  He states that she already has follow-up with him in clinic and she can follow-up with them as originally scheduled.  We will supplement her potassium.  Urology gave extensive discharge instructions and return precautions.  Patient and her husband report understanding, all questions answered to their satisfaction. [HN]    Clinical Course User Index [HN] Audley Hose, MD          Final Clinical Impression(s) / ED Diagnoses Final diagnoses:  Obstruction of right ureter  Gross hematuria  Right renal mass  Hypokalemia    Rx / DC Orders ED Discharge Orders          Ordered    ketorolac (TORADOL) 10 MG tablet  Every 6 hours PRN        01/23/22 1935             This note was created using dictation software, which may contain spelling or grammatical errors.    Audley Hose, MD 02/02/22 [phone]

## 2022-01-23 NOTE — ED Notes (Signed)
Patient transported to CT 

## 2022-01-23 NOTE — ED Triage Notes (Signed)
Pt reports stent  placed on 10/31 and removal today at 1230. Pt reports N/V, lightheaded, right sided abd pain worse with palpitation. LOC in wheelchair in waiting room per staff.

## 2022-02-17 DIAGNOSIS — R310 Gross hematuria: Secondary | ICD-10-CM | POA: Diagnosis not present

## 2022-02-25 DIAGNOSIS — M79641 Pain in right hand: Secondary | ICD-10-CM | POA: Diagnosis not present

## 2022-02-25 DIAGNOSIS — M79642 Pain in left hand: Secondary | ICD-10-CM | POA: Diagnosis not present

## 2022-02-25 DIAGNOSIS — M720 Palmar fascial fibromatosis [Dupuytren]: Secondary | ICD-10-CM | POA: Diagnosis not present

## 2022-05-13 DIAGNOSIS — R310 Gross hematuria: Secondary | ICD-10-CM | POA: Diagnosis not present

## 2022-05-15 DIAGNOSIS — R310 Gross hematuria: Secondary | ICD-10-CM | POA: Diagnosis not present

## 2022-05-15 DIAGNOSIS — R319 Hematuria, unspecified: Secondary | ICD-10-CM | POA: Diagnosis not present

## 2022-05-18 DIAGNOSIS — Z1231 Encounter for screening mammogram for malignant neoplasm of breast: Secondary | ICD-10-CM | POA: Diagnosis not present

## 2022-05-18 LAB — HM MAMMOGRAPHY

## 2022-05-21 ENCOUNTER — Encounter: Payer: Self-pay | Admitting: Family Medicine

## 2022-05-21 DIAGNOSIS — R310 Gross hematuria: Secondary | ICD-10-CM | POA: Diagnosis not present

## 2022-09-20 ENCOUNTER — Telehealth: Payer: BC Managed Care – PPO | Admitting: Family Medicine

## 2022-09-20 DIAGNOSIS — N3000 Acute cystitis without hematuria: Secondary | ICD-10-CM

## 2022-09-20 MED ORDER — CEPHALEXIN 500 MG PO CAPS: 500.0000 mg | ORAL_CAPSULE | Freq: Two times a day (BID) | ORAL | 0 refills | Status: AC

## 2022-09-20 MED ORDER — CEPHALEXIN 500 MG PO CAPS: 500.0000 mg | ORAL_CAPSULE | Freq: Two times a day (BID) | ORAL | 0 refills | Status: DC

## 2022-09-20 NOTE — Progress Notes (Signed)

## 2022-09-20 NOTE — Addendum Note (Signed)
Addended by: Georgana Curio on: 09/20/2022 07:22 PM   Modules accepted: Orders

## 2022-09-23 ENCOUNTER — Telehealth: Payer: BC Managed Care – PPO | Admitting: Family Medicine

## 2022-10-05 ENCOUNTER — Telehealth: Payer: BC Managed Care – PPO | Admitting: Family Medicine

## 2022-10-13 ENCOUNTER — Ambulatory Visit: Payer: BC Managed Care – PPO | Admitting: Physician Assistant

## 2022-10-13 ENCOUNTER — Other Ambulatory Visit: Payer: Self-pay | Admitting: Physician Assistant

## 2022-10-13 ENCOUNTER — Encounter: Payer: Self-pay | Admitting: Physician Assistant

## 2022-10-13 VITALS — BP 125/82 | HR 76 | Ht 66.0 in | Wt 135.0 lb

## 2022-10-13 DIAGNOSIS — Z808 Family history of malignant neoplasm of other organs or systems: Secondary | ICD-10-CM | POA: Diagnosis not present

## 2022-10-13 DIAGNOSIS — D229 Melanocytic nevi, unspecified: Secondary | ICD-10-CM

## 2022-10-13 DIAGNOSIS — C44619 Basal cell carcinoma of skin of left upper limb, including shoulder: Secondary | ICD-10-CM | POA: Diagnosis not present

## 2022-10-13 NOTE — Patient Instructions (Signed)
Skin Biopsy, Care After The following information offers guidance on how to care for yourself after your procedure. Your health care provider may also give you more specific instructions. If you have problems or questions, contact your health care provider. What can I expect after the procedure? After the procedure, it is common to have: Soreness or mild pain. Bruising. Itching. Some redness and swelling. Follow these instructions at home: Biopsy site care  Follow instructions from your health care provider about how to take care of your biopsy site. Make sure you: Wash your hands with soap and water for at least 20 seconds before and after you change your bandage (dressing). If soap and water are not available, use hand sanitizer. Change your dressing as told by your health care provider. Leave stitches (sutures), skin glue, or adhesive strips in place. These skin closures may need to stay in place for 2 weeks or longer. If adhesive strip edges start to loosen and curl up, you may trim the loose edges. Do not remove adhesive strips completely unless your health care provider tells you to do that. Check your biopsy site every day for signs of infection. Check for: More redness, swelling, or pain. Fluid or blood. Warmth. Pus or a bad smell. Do not take baths, swim, or use a hot tub until your health care provider approves. Ask your health care provider if you may take showers. You may only be allowed to take sponge baths. General instructions Take over-the-counter and prescription medicines only as told by your health care provider. Return to your normal activities as told by your health care provider. Ask your health care provider what activities are safe for you. Keep all follow-up visits. This is important. Contact a health care provider if: You have more redness, swelling, or pain around your biopsy site. You have fluid or blood coming from your biopsy site. Your biopsy site feels warm  to the touch. You have pus or a bad smell coming from your biopsy site. You have a fever. Your sutures, skin glue, or adhesive strips loosen or come off sooner than expected. Get help right away if: You have bleeding that does not stop with pressure or a dressing. Summary After the procedure, it is common to have soreness, bruising, and itching at the site. Follow instructions from your health care provider about how to take care of your biopsy site. Check your biopsy site every day for signs of infection. Contact a health care provider if you have more redness, swelling, or pain around your biopsy site, or your biopsy site feels warm to the touch. Keep all follow-up visits. This is important. This information is not intended to replace advice given to you by your health care provider. Make sure you discuss any questions you have with your health care provider. Document Revised: 10/08/2020 Document Reviewed: 10/08/2020 Elsevier Patient Education  2024 [employer].

## 2022-10-13 NOTE — Progress Notes (Signed)
Established Patient Office Visit  Subjective   Patient ID: Shannon Carr, female    DOB: Zyrah 13, 1967  Age: 57 y.o. MRN: [id number]  Chief Complaint  Patient presents with   mole removal    Pt states she has a mole on her left shoulder that she wants removed.    HPI  Pt has noticed a mole on her left shoulder that came up fast and has variation of hyperpigmentation. She has family hx of melanoma with her father and sister. She does wear sunscreen but loves being out in the sun.  No personal hx of skin cancer. She has dermatologist but cannot see her until December.  .. Active Ambulatory Problems    Diagnosis Date Noted   ANAL FISSURE 11/02/2008   Stress incontinence, female 04/06/2011   HSV (herpes simplex virus) anogenital infection 04/05/2012     06/20/2014   Hx of adenomatous colonic polyps 02/27/2016   LGSIL on Pap smear of cervix 12/14/2016   Kidney lesion, native, right 01/15/2022   Gross hematuria 01/15/2022   Lower abdominal pain 01/19/2022   Urinary incontinence 01/19/2022   Resolved Ambulatory Problems    Diagnosis Date Noted   No Resolved Ambulatory Problems   Past Medical History:  Diagnosis Date   Ovarian cyst        ROS See HPI.    Objective:     BP 125/82 (BP Location: Left Arm, Patient Position: Sitting, Cuff Size: Normal)   Pulse 76   Ht 5\' 6"  (1.676 m)   Wt 135 lb (61.2 kg)   LMP 06/19/2019 (Approximate)   SpO2 100%   BMI 21.79 kg/m  BP Readings from Last 3 Encounters:  10/13/22 125/82  01/23/22 114/71  01/20/22 125/85   Wt Readings from Last 3 Encounters:  10/13/22 135 lb (61.2 kg)  01/20/22 135 lb (61.2 kg)  01/19/22 138 lb (62.6 kg)   Shave Biopsy Procedure Note  Pre-operative Diagnosis: Suspicious lesion  Post-operative Diagnosis: same  Locations:left shoulder  Indications: r/o skin cancer  Anesthesia: Lidocaine 2% without epinephrine without added sodium bicarbonate  Procedure Details  History of allergy to iodine:  no  Patient informed of the risks (including bleeding and infection) and benefits of the  procedure and Verbal informed consent obtained.  The lesion and surrounding area were given a sterile prep using chlorhexidine and draped in the usual sterile fashion. A scalpel was used to shave an area of skin approximately 3mm by 2mm  Hemostasis achieved with alumuninum chloride. Antibiotic ointment and a sterile dressing applied.  The specimen was sent for pathologic examination. The patient tolerated the procedure well.  EBL: scant  Condition: Stable  Complications: .  Plan: 1. Instructed to keep the wound dry and covered for 24-48h and clean thereafter. 2. Warning signs of infection were reviewed.   3. Recommended that the patient use  as needed for pain.    Physical Exam 3mm by 2mm raised non pigmented nevus with hyperpigmentation with irregular borders from 6oclock to 10 oclock on left shoulder.   Tan skin with scattered age spots over sun exposed surfaces.     Assessment & Plan:  Marland KitchenMarland KitchenApril was seen today for mole removal.  Diagnoses and all orders for this visit:  Suspicious nevus -     Surgical pathology  Family history of melanoma -     Surgical pathology   Rapidly changing mole with hyperpigmentation that is not symmetric and family hx of melanoma.  Shave biopsy done today.  Continue to  wear sunscreen and do regular skin checks.  Discussed care after biopsy. Will call with results.     Tandy Gaw, PA-C

## 2022-10-22 ENCOUNTER — Other Ambulatory Visit: Payer: Self-pay | Admitting: Family Medicine

## 2022-10-22 ENCOUNTER — Encounter: Payer: Self-pay | Admitting: Physician Assistant

## 2022-10-22 ENCOUNTER — Telehealth: Payer: Self-pay | Admitting: Family Medicine

## 2022-10-22 DIAGNOSIS — C4491 Basal cell carcinoma of skin, unspecified: Secondary | ICD-10-CM | POA: Insufficient documentation

## 2022-10-22 MED ORDER — IMIQUIMOD 5 % EX CREA
TOPICAL_CREAM | CUTANEOUS | 1 refills | Status: DC
Start: 2022-10-22 — End: 2022-12-28

## 2022-10-22 MED ORDER — FLUOROURACIL 5 % EX CREA: TOPICAL_CREAM | Freq: Two times a day (BID) | CUTANEOUS | 0 refills | Status: DC

## 2022-10-22 MED ORDER — IMIQUIMOD 5 % EX CREA: TOPICAL_CREAM | CUTANEOUS | 1 refills | Status: DC

## 2022-10-22 NOTE — Telephone Encounter (Signed)
Meds ordered this encounter  Medications   fluorouracil (EFUDEX) 5 % cream    Sig: Apply topically 2 (two) times daily.    Dispense:  40 g    Refill:  0

## 2022-10-22 NOTE — Progress Notes (Signed)
Pathology showed basal cell cancer. We do need to get you into dermatology to likely do some more tissue scraping and make sure all abnormal cells are removed. I think you have appt already, correct?

## 2022-10-22 NOTE — Telephone Encounter (Signed)
Meds ordered this encounter  Medications   imiquimod (ALDARA) 5 % cream    Sig: Apply once daily 5 days per week, prior to bedtime, x 6 weeks; leave on skin for ~8 hours, then remove with mild soap and water. Total 6 weeks treatment.    Dispense:  30 each    Refill:  1

## 2022-10-30 ENCOUNTER — Encounter: Payer: Self-pay | Admitting: Internal Medicine

## 2022-10-30 ENCOUNTER — Telehealth: Payer: BC Managed Care – PPO | Admitting: Physician Assistant

## 2022-10-30 DIAGNOSIS — R3989 Other symptoms and signs involving the genitourinary system: Secondary | ICD-10-CM

## 2022-10-30 MED ORDER — SULFAMETHOXAZOLE-TRIMETHOPRIM 800-160 MG PO TABS
1.0000 | ORAL_TABLET | Freq: Two times a day (BID) | ORAL | 0 refills | Status: DC
Start: 2022-10-30 — End: 2022-12-28

## 2022-10-30 NOTE — Progress Notes (Signed)
E-Visit for Urinary Problems  We are sorry that you are not feeling well.  Here is how we plan to help!  Based on what you shared with me it looks like you most likely have a simple urinary tract infection.  A UTI (Urinary Tract Infection) is a bacterial infection of the bladder.  Most cases of urinary tract infections are simple to treat but a key part of your care is to encourage you to drink plenty of fluids and watch your symptoms carefully.  I have prescribed Bactrim DS One tablet twice a day for 5 days.  Your symptoms should gradually improve. Call us if the burning in your urine worsens, you develop worsening fever, back pain or pelvic pain or if your symptoms do not resolve after completing the antibiotic.  Urinary tract infections can be prevented by drinking plenty of water to keep your body hydrated.  Also be sure when you wipe, wipe from front to back and don't hold it in!  If possible, empty your bladder every 4 hours.  HOME CARE Drink plenty of fluids Compete the full course of the antibiotics even if the symptoms resolve Remember, when you need to go.go. Holding in your urine can increase the likelihood of getting a UTI! GET HELP RIGHT AWAY IF: You cannot urinate You get a high fever Worsening back pain occurs You see blood in your urine You feel sick to your stomach or throw up You feel like you are going to pass out  MAKE SURE YOU  Understand these instructions. Will watch your condition. Will get help right away if you are not doing well or get worse.   Thank you for choosing an e-visit.  Your e-visit answers were reviewed by a board certified advanced clinical practitioner to complete your personal care plan. Depending upon the condition, your plan could have included both over the counter or prescription medications.  Please review your pharmacy choice. Make sure the pharmacy is open so you can pick up prescription now. If there is a problem, you may contact  your provider through MyChart messaging and have the prescription routed to another pharmacy.  Your safety is important to us. If you have drug allergies check your prescription carefully.   For the next 24 hours you can use MyChart to ask questions about today's visit, request a non-urgent call back, or ask for a work or school excuse. You will get an email in the next two days asking about your experience. I hope that your e-visit has been valuable and will speed your recovery.  I have spent 5 minutes in review of e-visit questionnaire, review and updating patient chart, medical decision making and response to patient.   Jennifer M Burnette, PA-C  

## 2022-11-04 DIAGNOSIS — L570 Actinic keratosis: Secondary | ICD-10-CM | POA: Diagnosis not present

## 2022-11-04 DIAGNOSIS — C44619 Basal cell carcinoma of skin of left upper limb, including shoulder: Secondary | ICD-10-CM | POA: Diagnosis not present

## 2022-11-17 DIAGNOSIS — N3020 Other chronic cystitis without hematuria: Secondary | ICD-10-CM | POA: Diagnosis not present

## 2022-11-17 DIAGNOSIS — N952 Postmenopausal atrophic vaginitis: Secondary | ICD-10-CM | POA: Diagnosis not present

## 2022-12-28 ENCOUNTER — Telehealth (INDEPENDENT_AMBULATORY_CARE_PROVIDER_SITE_OTHER): Payer: BC Managed Care – PPO | Admitting: Family Medicine

## 2022-12-28 ENCOUNTER — Encounter: Payer: Self-pay | Admitting: Family Medicine

## 2022-12-28 DIAGNOSIS — B351 Tinea unguium: Secondary | ICD-10-CM | POA: Diagnosis not present

## 2022-12-28 MED ORDER — JUBLIA 10 % EX SOLN: 1.0000 | Freq: Every day | CUTANEOUS | 3 refills | Status: DC

## 2022-12-28 MED ORDER — TERBINAFINE HCL 250 MG PO TABS
250.0000 mg | ORAL_TABLET | ORAL | 1 refills | Status: DC
Start: 2022-12-28 — End: 2022-12-29

## 2022-12-28 NOTE — Progress Notes (Signed)
    Virtual Visit via Video Note  I connected with Shannon Carr on 12/28/22 at  7:10 AM EDT by a video enabled telemedicine application and verified that I am speaking with the correct person using two identifiers.   I discussed the limitations of evaluation and management by telemedicine and the availability of in person appointments. The patient expressed understanding and agreed to proceed.  Patient location: at home Provider location: in office  Subjective:    CC:  No chief complaint on file.   HPI: reports that she gets gel pedicures and the last pedicure 2 months ago she noticed that when she cut the nail it crumbled and the nail was yellow and thick. This is her L great toenail.  She was looking into different options for treatment she is hesitant to do an oral systemic medication.  [place] and is try to get her house ready for the move.   Past medical history, Surgical history, Family history not pertinant except as noted below, Social history, Allergies, and medications have been entered into the medical record, reviewed, and corrections made.    Objective:    General: Speaking clearly in complete sentences without any shortness of breath.  Alert and oriented x3.  Normal judgment. No apparent acute distress.    Impression and Recommendations:    Problem List Items Addressed This Visit   None Visit Diagnoses     Onychomycosis    -  Primary   Relevant Medications   Efinaconazole (JUBLIA) 10 % SOLN   terbinafine (LAMISIL) 250 MG tablet      Treatment options we will see if we can get the Jublia covered with her current insurance and a co-pay card.  She can also do the topical tea tree oil which can be helpful as well.  Also will do once weekly dosing of terbinafine 250 mg.  If not improving over the next 3 months then please let us know.  No orders of the defined types were placed in this encounter.   Meds ordered this  encounter  Medications   Efinaconazole (JUBLIA) 10 % SOLN    Sig: Apply 1 Application topically daily. To top of entire nail and around borders.  X 48 weeks    Dispense:  12 mL    Refill:  3    Will bring coupon card   terbinafine (LAMISIL) 250 MG tablet    Sig: Take 1 tablet (250 mg total) by mouth once a week.    Dispense:  12 tablet    Refill:  1     I discussed the assessment and treatment plan with the patient. The patient was provided an opportunity to ask questions and all were answered. The patient agreed with the plan and demonstrated an understanding of the instructions.   The patient was advised to call back or seek an in-person evaluation if the symptoms worsen or if the condition fails to improve as anticipated.  I spent 20 minutes on the day of the encounter to include pre-visit record review, face-to-face time with the patient and post visit ordering of test.    Nani Gasser, MD

## 2022-12-28 NOTE — Progress Notes (Signed)
Pot reports that she gets gel pedicures and the last pedicure 2 months ago she noticed that when she cut the nail it crumbled and the nail was yellow and thick. This is her L great toenail.

## 2022-12-29 MED ORDER — FLUCONAZOLE 150 MG PO TABS: 150.0000 mg | ORAL_TABLET | ORAL | 0 refills | Status: DC

## 2022-12-29 NOTE — Addendum Note (Signed)
Addended by: Nani Gasser D on: 12/29/2022 04:14 PM   Modules accepted: Orders

## 2023-02-09 ENCOUNTER — Telehealth: Payer: Self-pay | Admitting: Internal Medicine

## 2023-02-09 ENCOUNTER — Ambulatory Visit (AMBULATORY_SURGERY_CENTER): Payer: BC Managed Care – PPO | Admitting: *Deleted

## 2023-02-09 VITALS — Ht 66.0 in | Wt 139.0 lb

## 2023-02-09 DIAGNOSIS — Z860100 Personal history of colon polyps, unspecified: Secondary | ICD-10-CM

## 2023-02-09 DIAGNOSIS — Z800 Family history of malignant neoplasm of digestive organs: Secondary | ICD-10-CM

## 2023-02-09 NOTE — Telephone Encounter (Signed)
Called and spoke with patient-patient advised that updated instructions have been placed in her MyChart;  Patient verbalized understanding of information/instructions;  Patient advised to call back to the office at [phone] should questions/concerns arise;

## 2023-02-09 NOTE — Progress Notes (Signed)
Pt's name and DOB verified at the beginning of the pre-visit wit 2 identifiers  Pt denies any difficulty with ambulating,sitting, laying down or rolling side to side  Pt has issues with ambulation   Pt has no issues moving head neck or swallowing  No egg or soy allergy known to patient   No issues known to pt with past sedation with any surgeries or procedures  No FH of Malignant Hyperthermia  Pt is not on diet pills or shots  Pt is not on home 02   Pt is not on blood thinners   Pt denies issues with constipation   Pt is not on dialysis  Pt denise any abnormal heart rhythms   Pt denies any upcoming cardiac testing  Pt encouraged to use to use Singlecare or Goodrx to reduce cost   Patient's chart reviewed by Shannon Carr CNRA prior to pre-visit and patient appropriate for the [facility].  Pre-visit completed and red dot placed by patient's name on their procedure day (on provider's schedule).  .  Visit by phone  Pt states weight is 139 lb  Instructed pt why it is important to and  to call if they have any changes in health or new medications. Directed them to the # given and on instructions.     Instructions reviewed. Pt given both [facility] main # and MD on call # prior to instructions.  Pt states understanding. Instructed to review again prior to procedure. Pt states they will.   Instructions sent by mail with coupon and by My Chart

## 2023-02-09 NOTE — Telephone Encounter (Signed)
PT changed procedure date and needs updated times. Please advise.

## 2023-02-22 DIAGNOSIS — N3020 Other chronic cystitis without hematuria: Secondary | ICD-10-CM | POA: Diagnosis not present

## 2023-02-24 ENCOUNTER — Encounter: Payer: Self-pay | Admitting: Internal Medicine

## 2023-02-24 DIAGNOSIS — L910 Hypertrophic scar: Secondary | ICD-10-CM | POA: Diagnosis not present

## 2023-02-24 DIAGNOSIS — L304 Erythema intertrigo: Secondary | ICD-10-CM | POA: Diagnosis not present

## 2023-02-24 DIAGNOSIS — D225 Melanocytic nevi of trunk: Secondary | ICD-10-CM | POA: Diagnosis not present

## 2023-02-24 DIAGNOSIS — L658 Other specified nonscarring hair loss: Secondary | ICD-10-CM | POA: Diagnosis not present

## 2023-03-02 ENCOUNTER — Encounter: Payer: BC Managed Care – PPO | Admitting: Internal Medicine

## 2023-03-06 ENCOUNTER — Encounter: Payer: Self-pay | Admitting: Certified Registered Nurse Anesthetist

## 2023-03-07 NOTE — Progress Notes (Unsigned)
[facility] History and Physical   Primary Care Physician:  Agapito Games, MD   Reason for Procedure:   Hx colon polyps  Plan:    colonoscopy     HPI: Shannon Carr is a 57 y.o. female for polyp surveillance 02/2016 - 6 adenomas max 10 mm 02/2018 1 diminutive ssp  Past Medical History:  Diagnosis Date   Asthma    exercise induced but has been while per pt   Hx of adenomatous colonic polyps 02/27/2016   Ovarian cyst     Past Surgical History:  Procedure Laterality Date   ANTERIOR AND POSTERIOR REPAIR  12/2012   COLONOSCOPY  2017   CYSTOSCOPY WITH RETROGRADE PYELOGRAM, URETEROSCOPY AND STENT PLACEMENT Right 01/20/2022   Procedure: CYSTOSCOPY WITH bilateral RETROGRADE PYELOGRAM, URETEROSCOPY AND STENT PLACEMENT, right ureteral biopsy;  Surgeon: Crist Fat, MD;  Location: [facility];  Service: Urology;  Laterality: Right;   TONSILLECTOMY AND ADENOIDECTOMY      Prior to Admission medications   Medication Sig Start Date End Date Taking? Authorizing Provider  acyclovir (ZOVIRAX) 400 MG tablet Take 1 tablet (400 mg total) by mouth daily as needed. 03/12/20   Everrett Coombe, DO  bimatoprost (LUMIGAN) 0.03 % ophthalmic solution SMARTSIG:In Eye(s) 01/15/23   [provider]  Efinaconazole (JUBLIA) 10 % SOLN Apply 1 Application topically daily. To top of entire nail and around borders.  X 48 weeks 12/28/22   Agapito Games, MD  estradiol (ESTRACE) 0.1 MG/GM vaginal cream Place vaginally. 01/25/23   [provider]  finasteride (PROPECIA) 1 MG tablet Take 1 tablet (1 mg total) by mouth daily. 01/19/22   Breeback, Jade L, PA-C  fluconazole (DIFLUCAN) 150 MG tablet Take 1 tablet (150 mg total) by mouth once a week. Patient not taking: Reported on 02/09/2023 12/29/22   Agapito Games, MD  nitrofurantoin, macrocrystal-monohydrate, (MACROBID) 100 MG capsule Take 100 mg by mouth at bedtime as needed. 11/17/22   [provider]     Current Outpatient Medications  Medication Sig Dispense Refill   acyclovir (ZOVIRAX) 400 MG tablet Take 1 tablet (400 mg total) by mouth daily as needed. 90 tablet 1   bimatoprost (LUMIGAN) 0.03 % ophthalmic solution SMARTSIG:In Eye(s)     Efinaconazole (JUBLIA) 10 % SOLN Apply 1 Application topically daily. To top of entire nail and around borders.  X 48 weeks 12 mL 3   estradiol (ESTRACE) 0.1 MG/GM vaginal cream Place vaginally.     finasteride (PROPECIA) 1 MG tablet Take 1 tablet (1 mg total) by mouth daily. 90 tablet 0   fluconazole (DIFLUCAN) 150 MG tablet Take 1 tablet (150 mg total) by mouth once a week. (Patient not taking: Reported on 02/09/2023) 12 tablet 0   nitrofurantoin, macrocrystal-monohydrate, (MACROBID) 100 MG capsule Take 100 mg by mouth at bedtime as needed.     No current facility-administered medications for this visit.    Allergies as of 03/08/2023   (No Known Allergies)    Family History  Problem Relation Age of Onset   Melanoma Father    Colon cancer Maternal Aunt 72   Colon cancer Other        Elderly   Colon polyps Neg Hx    Esophageal cancer Neg Hx    Rectal cancer Neg Hx    Stomach cancer Neg Hx     Social History   Socioeconomic History   Marital status: Married    Spouse name: Not on file   Number of children:  3   Years of education: Not on file   Highest education level: Professional school degree (e.g., MD, DDS, DVM, JD)  Occupational History   Occupation: FNP     Employer: [employer]   Tobacco Use   Smoking status: Never   Smokeless tobacco: Never  Vaping Use   Vaping status: Never Used  Substance and Sexual Activity   Alcohol use: Yes    Comment: weekly-social   Drug use: No   Sexual activity: Yes    Partners: Male    Birth control/protection: Surgical    Comment: Vasectomy  Other Topics Concern   Not on file  Social History Narrative   Married, 3 children    FNP [employer]   Work in [employer]. Works out  regularly.    Never smoker, no drug use some alcohol   Social Drivers of Corporate investment banker Strain: Low Risk  (11/30/2017)   Overall Financial Resource Strain (CARDIA)    Difficulty of Paying Living Expenses: Not hard at all  Food Insecurity: Not on file  Transportation Needs: Not on file  Physical Activity: Not on file  Stress: Not on file  Social Connections: Not on file  Intimate Partner Violence: Not on file    Review of Systems: Positive for *** All other review of systems negative except as mentioned in the HPI.  Physical Exam: Vital signs LMP 06/19/2019 (Approximate)   General:   Alert,  Well-developed, well-nourished, pleasant and cooperative in NAD Lungs:  Clear throughout to auscultation.   Heart:  Regular rate and rhythm; no murmurs, clicks, rubs,  or gallops. Abdomen:  Soft, nontender and nondistended. Normal bowel sounds.   Neuro/Psych:  Alert and cooperative. Normal mood and affect. A and O x 3   @Adalynd Donahoe  Sena Slate, MD, [facility] [phone] (pager) 03/07/2023 4:43 PM@

## 2023-03-08 ENCOUNTER — Ambulatory Visit: Payer: BC Managed Care – PPO | Admitting: Internal Medicine

## 2023-03-08 ENCOUNTER — Encounter: Payer: Self-pay | Admitting: Internal Medicine

## 2023-03-08 VITALS — BP 115/67 | HR 53 | Temp 97.6°F | Resp 11 | Ht 66.0 in | Wt 139.0 lb

## 2023-03-08 DIAGNOSIS — Z860100 Personal history of colon polyps, unspecified: Secondary | ICD-10-CM

## 2023-03-08 DIAGNOSIS — Z1211 Encounter for screening for malignant neoplasm of colon: Secondary | ICD-10-CM

## 2023-03-08 DIAGNOSIS — Z860101 Personal history of adenomatous and serrated colon polyps: Secondary | ICD-10-CM

## 2023-03-08 DIAGNOSIS — K635 Polyp of colon: Secondary | ICD-10-CM

## 2023-03-08 DIAGNOSIS — D122 Benign neoplasm of ascending colon: Secondary | ICD-10-CM

## 2023-03-08 DIAGNOSIS — Z800 Family history of malignant neoplasm of digestive organs: Secondary | ICD-10-CM

## 2023-03-08 MED ORDER — SODIUM CHLORIDE 0.9 % IV SOLN: 500.0000 mL | Freq: Once | INTRAVENOUS | Status: DC

## 2023-03-08 NOTE — Progress Notes (Signed)
Report given to PACU, vss 

## 2023-03-08 NOTE — Progress Notes (Signed)
Pt's states no medical or surgical changes since previsit or office visit. 

## 2023-03-08 NOTE — Patient Instructions (Addendum)
One polyp removed today.  I will let you know pathology results and when to have another routine colonoscopy by mail and/or My Chart.  I appreciate the opportunity to care for you. Iva Boop, MD, [facility]  Resume previous diet Continue present medications Await pathology results  Handouts/information given for polyps  YOU HAD AN ENDOSCOPIC PROCEDURE TODAY AT THE [facility]:   Refer to the procedure report that was given to you for any specific questions about what was found during the examination.  If the procedure report does not answer your questions, please call your gastroenterologist to clarify.  If you requested that your care partner not be given the details of your procedure findings, then the procedure report has been included in a sealed envelope for you to review at your convenience later.  YOU SHOULD EXPECT: Some feelings of bloating in the abdomen. Passage of more gas than usual.  Walking can help get rid of the air that was put into your GI tract during the procedure and reduce the bloating. If you had a lower endoscopy (such as a colonoscopy or flexible sigmoidoscopy) you may notice spotting of blood in your stool or on the toilet paper. If you underwent a bowel prep for your procedure, you may not have a normal bowel movement for a few days.  Please Note:  You might notice some irritation and congestion in your nose or some drainage.  This is from the oxygen used during your procedure.  There is no need for concern and it should clear up in a day or so.  SYMPTOMS TO REPORT IMMEDIATELY:  Following lower endoscopy (colonoscopy):  Excessive amounts of blood in the stool  Significant tenderness or worsening of abdominal pains  Swelling of the abdomen that is new, acute  Fever of 100F or higher  For urgent or emergent issues, a gastroenterologist can be reached at any hour by calling [phone]. Do not use MyChart messaging for urgent concerns.   DIET:   We do recommend a small meal at first, but then you may proceed to your regular diet.  Drink plenty of fluids but you should avoid alcoholic beverages for 24 hours.  ACTIVITY:  You should plan to take it easy for the rest of today and you should NOT DRIVE or use heavy machinery until tomorrow (because of the sedation medicines used during the test).    FOLLOW UP: Our staff will call the number listed on your records the next business day following your procedure.  We will call around 7:15- 8:00 am to check on you and address any questions or concerns that you may have regarding the information given to you following your procedure. If we do not reach you, we will leave a message.     If any biopsies were taken you will be contacted by phone or by letter within the next 1-3 weeks.  Please call us at [phone] if you have not heard about the biopsies in 3 weeks.   SIGNATURES/CONFIDENTIALITY: You and/or your care partner have signed paperwork which will be entered into your electronic medical record.  These signatures attest to the fact that that the information above on your After Visit Summary has been reviewed and is understood.  Full responsibility of the confidentiality of this discharge information lies with you and/or your care-partner.

## 2023-03-08 NOTE — Op Note (Signed)
[facility] Patient Name: Shannon Carr Procedure Date: 03/08/2023 9:49 AM MRN: [id number] Endoscopist: Iva Boop , MD, [id number] Age: 57 Referring MD:  Date of Birth: May 01, 1965 Gender: Female Account #: [id number] Procedure:                Colonoscopy Indications:              High risk colon cancer surveillance: Personal                            history of colonic polyps, Last colonoscopy: 2019 Medicines:                Monitored Anesthesia Care Procedure:                Pre-Anesthesia Assessment:                           - Prior to the procedure, a History and Physical                            was performed, and patient medications and                            allergies were reviewed. The patient's tolerance of                            previous anesthesia was also reviewed. The risks                            and benefits of the procedure and the sedation                            options and risks were discussed with the patient.                            All questions were answered, and informed consent                            was obtained. Prior Anticoagulants: The patient has                            taken no anticoagulant or antiplatelet agents. ASA                            Grade Assessment: II - A patient with mild systemic                            disease. After reviewing the risks and benefits,                            the patient was deemed in satisfactory condition to                            undergo the procedure.  After obtaining informed consent, the colonoscope                            was passed under direct vision. Throughout the                            procedure, the patient's blood pressure, pulse, and                            oxygen saturations were monitored continuously. The                            Olympus Scope [id number] was introduced through the                            anus and  advanced to the the cecum, identified by                            appendiceal orifice and ileocecal valve. The                            colonoscopy was performed without difficulty. The                            patient tolerated the procedure well. The quality                            of the bowel preparation was good. The ileocecal                            valve, appendiceal orifice, and rectum were                            photographed. The bowel preparation used was                            Miralax via split dose instruction. Scope In: 9:56:14 AM Scope Out: 10:14:40 AM Scope Withdrawal Time: 0 hours 14 minutes 52 seconds  Total Procedure Duration: 0 hours 18 minutes 26 seconds  Findings:                 The perianal and digital rectal examinations were                            normal.                           A 10 mm polyp was found in the ascending colon. The                            polyp was sessile. The polyp was removed with a                            cold snare. Resection and retrieval were complete.  Verification of patient identification for the                            specimen was done. Estimated blood loss was minimal.                           The exam was otherwise without abnormality on                            direct and retroflexion views. Complications:            No immediate complications. Estimated Blood Loss:     Estimated blood loss was minimal. Impression:               - One 10 mm polyp in the ascending colon, removed                            with a cold snare. Resected and retrieved.                           - The examination was otherwise normal on direct                            and retroflexion views.                           - Personal history of colonic polyps.                           02/2016 - 6 adenomas max 10 mm                           02/2018 1 diminutive ssp                           Also  has FHx of CRCA in maternal aunt and maternal                            great uncle Recommendation:           - Patient has a contact number available for                            emergencies. The signs and symptoms of potential                            delayed complications were discussed with the                            patient. Return to normal activities tomorrow.                            Written discharge instructions were provided to the                            patient.                           -  Resume previous diet.                           - Continue present medications.                           - Repeat colonoscopy is recommended for                            surveillance. The colonoscopy date will be                            determined after pathology results from today's                            exam become available for review. Iva Boop, MD 03/08/2023 10:21:45 AM This report has been signed electronically.

## 2023-03-08 NOTE — Progress Notes (Signed)
Called to room to assist during endoscopic procedure.  Patient ID and intended procedure confirmed with present staff. Received instructions for my participation in the procedure from the performing physician.  

## 2023-03-09 ENCOUNTER — Telehealth: Payer: Self-pay

## 2023-03-09 NOTE — Telephone Encounter (Signed)
Follow up call placed, VM obtained and message left. 

## 2023-03-10 LAB — SURGICAL PATHOLOGY

## 2023-03-13 ENCOUNTER — Encounter: Payer: Self-pay | Admitting: Internal Medicine

## 2023-05-25 DIAGNOSIS — Z1231 Encounter for screening mammogram for malignant neoplasm of breast: Secondary | ICD-10-CM | POA: Diagnosis not present

## 2023-05-25 LAB — HM MAMMOGRAPHY

## 2023-05-28 ENCOUNTER — Encounter (INDEPENDENT_AMBULATORY_CARE_PROVIDER_SITE_OTHER): Payer: Self-pay | Admitting: Family Medicine

## 2023-05-28 DIAGNOSIS — R928 Other abnormal and inconclusive findings on diagnostic imaging of breast: Secondary | ICD-10-CM

## 2023-05-28 NOTE — Telephone Encounter (Signed)

## 2023-05-31 ENCOUNTER — Encounter: Payer: Self-pay | Admitting: Family Medicine

## 2023-06-01 ENCOUNTER — Telehealth: Payer: Self-pay | Admitting: Family Medicine

## 2023-06-01 DIAGNOSIS — R928 Other abnormal and inconclusive findings on diagnostic imaging of breast: Secondary | ICD-10-CM | POA: Insufficient documentation

## 2023-06-01 NOTE — Telephone Encounter (Signed)
 Copied from CRM [id number]. Topic: General - Other >> Jun 01, 2023  2:51 PM Dondra Prader E wrote: Reason for CRM: Needs diagnostic mammogram imaging order sent to the correct location.   Please send order to [facility] in [facility]  Fax: [phone]

## 2023-06-01 NOTE — Telephone Encounter (Signed)
 Yes, I will obtain authorization for that location.

## 2023-06-01 NOTE — Telephone Encounter (Signed)
 Patient informed and will call tomorrow to schedule. She will reach out to [place] if has any difficulty with order.

## 2023-06-03 ENCOUNTER — Other Ambulatory Visit: Payer: Self-pay | Admitting: *Deleted

## 2023-06-03 NOTE — Progress Notes (Signed)
 error

## 2023-06-09 DIAGNOSIS — R92333 Mammographic heterogeneous density, bilateral breasts: Secondary | ICD-10-CM | POA: Diagnosis not present

## 2023-06-09 DIAGNOSIS — R928 Other abnormal and inconclusive findings on diagnostic imaging of breast: Secondary | ICD-10-CM | POA: Diagnosis not present

## 2023-06-10 ENCOUNTER — Encounter: Payer: Self-pay | Admitting: Sports Medicine

## 2023-06-22 ENCOUNTER — Encounter: Payer: BC Managed Care – PPO | Admitting: Family Medicine

## 2023-08-02 DIAGNOSIS — D229 Melanocytic nevi, unspecified: Secondary | ICD-10-CM | POA: Diagnosis not present

## 2023-08-02 DIAGNOSIS — Z85828 Personal history of other malignant neoplasm of skin: Secondary | ICD-10-CM | POA: Diagnosis not present

## 2023-08-02 DIAGNOSIS — L821 Other seborrheic keratosis: Secondary | ICD-10-CM | POA: Diagnosis not present

## 2023-08-02 DIAGNOSIS — L814 Other melanin hyperpigmentation: Secondary | ICD-10-CM | POA: Diagnosis not present

## 2023-08-03 ENCOUNTER — Encounter: Payer: BC Managed Care – PPO | Admitting: Family Medicine

## 2023-08-06 ENCOUNTER — Encounter: Payer: Self-pay | Admitting: Family Medicine

## 2023-08-06 ENCOUNTER — Ambulatory Visit (INDEPENDENT_AMBULATORY_CARE_PROVIDER_SITE_OTHER): Admitting: Family Medicine

## 2023-08-06 VITALS — BP 131/76 | HR 70 | Ht 66.0 in | Wt 140.2 lb

## 2023-08-06 DIAGNOSIS — A609 Anogenital herpesviral infection, unspecified: Secondary | ICD-10-CM | POA: Diagnosis not present

## 2023-08-06 DIAGNOSIS — Z23 Encounter for immunization: Secondary | ICD-10-CM

## 2023-08-06 DIAGNOSIS — E7800 Pure hypercholesterolemia, unspecified: Secondary | ICD-10-CM | POA: Diagnosis not present

## 2023-08-06 DIAGNOSIS — L709 Acne, unspecified: Secondary | ICD-10-CM | POA: Diagnosis not present

## 2023-08-06 DIAGNOSIS — Z0000 Encounter for general adult medical examination without abnormal findings: Secondary | ICD-10-CM

## 2023-08-06 MED ORDER — VALACYCLOVIR HCL 1 G PO TABS: 1000.0000 mg | ORAL_TABLET | Freq: Two times a day (BID) | ORAL | 99 refills | Status: AC

## 2023-08-06 NOTE — Assessment & Plan Note (Signed)
 Uses tretinoin . Will call when needs refill.

## 2023-08-06 NOTE — Progress Notes (Signed)
 Complete physical exam  Patient: Shannon Carr   DOB: 03/29/1965   58 y.o. Female  MRN: [id number]  Subjective:     Chief Complaint  Patient presents with   Annual Exam    Shannon Carr is a 58 y.o. female who presents today for a complete physical exam. She reports consuming a general diet. Still exercising with walking and cardio. Plans on starting back withweigh training.  She generally feels well.  She does not have additional problems to discuss today.  Mammogram is UTD. Saw Derm as well.    Most recent fall risk assessment:    10/13/2022    1:27 PM  Fall Risk   Falls in the past year? 0  Number falls in past yr: 0  Injury with Fall? 0  Risk for fall due to : No Fall Risks  Follow up Falls evaluation completed     Most recent depression screenings:    10/13/2022    1:27 PM 08/04/2021    8:47 AM  PHQ 2/9 Scores  PHQ - 2 Score 0 0         Patient Care Team: Cydney Draft, MD as PCP - General (Family Medicine)   Outpatient Medications Prior to Visit  Medication Sig   silver sulfADIAZINE (SILVADENE) 1 % cream Apply 1 Application topically daily as needed.   triamcinolone cream (KENALOG) 0.1 % Apply 1 Application topically daily as needed.   bimatoprost (LUMIGAN) 0.03 % ophthalmic solution SMARTSIG:In Eye(s)   Efinaconazole  (JUBLIA ) 10 % SOLN Apply 1 Application topically daily. To top of entire nail and around borders.  X 48 weeks   estradiol  (ESTRACE ) 0.1 MG/GM vaginal cream Place vaginally.   finasteride  (PROSCAR ) 5 MG tablet Take 0.5 tablets by mouth daily.   minoxidil (LONITEN) 2.5 MG tablet Take 2.5 mg by mouth daily.   nitrofurantoin , macrocrystal-monohydrate, (MACROBID ) 100 MG capsule Take 100 mg by mouth at bedtime as needed.   [DISCONTINUED] acyclovir  (ZOVIRAX ) 400 MG tablet Take 1 tablet (400 mg total) by mouth daily as needed.   No facility-administered medications prior to visit.    ROS        Objective:     BP 131/76   Pulse 70    Ht 5\' 6"  (1.676 m)   Wt 140 lb 3.2 oz (63.6 kg)   LMP 06/19/2019 (Approximate)   SpO2 100%   BMI 22.63 kg/m     Physical Exam Constitutional:      Appearance: Normal appearance.  HENT:     Head: Normocephalic and atraumatic.     Right Ear: Tympanic membrane, ear canal and external ear normal.     Left Ear: Tympanic membrane, ear canal and external ear normal.     Nose: Nose normal.     Mouth/Throat:     Pharynx: Oropharynx is clear.  Eyes:     Extraocular Movements: Extraocular movements intact.     Conjunctiva/sclera: Conjunctivae normal.     Pupils: Pupils are equal, round, and reactive to light.  Neck:     Thyroid : No thyromegaly.  Cardiovascular:     Rate and Rhythm: Normal rate and regular rhythm.  Pulmonary:     Effort: Pulmonary effort is normal.     Breath sounds: Normal breath sounds.  Abdominal:     General: Bowel sounds are normal.     Palpations: Abdomen is soft.     Tenderness: There is no abdominal tenderness.  Musculoskeletal:        General: No swelling.  Cervical back: Neck supple.  Skin:    General: Skin is warm and dry.     Comments: Acne on her chin and face.   Neurological:     Mental Status: She is oriented to person, place, and time.  Psychiatric:        Mood and Affect: Mood normal.        Behavior: Behavior normal.      No results found for any visits on 08/06/23.      Assessment & Plan:    Routine Health Maintenance and Physical Exam  Immunization History  Administered Date(s) Administered   Fluzone Influenza virus vaccine,trivalent (IIV3), split virus 01/27/2001, 01/05/2003, 03/31/2005   Hepatitis B, ADULT 10/17/1999   Influenza Split 04/05/2012   Influenza, Seasonal, Injecte, Preservative Fre 01/28/2015   PNEUMOCOCCAL CONJUGATE-20 08/06/2023   Td 05/11/2007   Tdap 09/14/2016   Zoster Recombinant(Shingrix) 04/04/2019, 07/01/2019    Health Maintenance  Topic Date Due   COVID-19 Vaccine (1) Never done   INFLUENZA VACCINE   10/22/2023   MAMMOGRAM  05/24/2024   Cervical Cancer Screening (HPV/Pap Cotest)  08/05/2026   DTaP/Tdap/Td (3 - Td or Tdap) 09/15/2026   Colonoscopy  03/07/2028   Hepatitis C Screening  Completed   HIV Screening  Completed   Zoster Vaccines- Shingrix  Completed   Pneumococcal Vaccine 26-29 Years old  Aged Out   HPV VACCINES  Aged Out   Meningococcal B Vaccine  Aged Out    Discussed health benefits of physical activity, and encouraged her to engage in regular exercise appropriate for her age and condition.  Problem List Items Addressed This Visit       Musculoskeletal and Integument   Adult acne   Uses tretinoin . Will call when needs refill.         Other   HSV (herpes simplex virus) anogenital infection   Relevant Medications   valACYclovir (VALTREX) 1000 MG tablet   Other Visit Diagnoses       Wellness examination    -  Primary   Relevant Orders   CMP14+EGFR   Lipid panel   CBC   TSH     Encounter for immunization       Relevant Orders   Pneumococcal conjugate vaccine 20-valent (Completed)       Keep up a regular exercise program and make sure you are eating a healthy diet Try to eat 4 servings of dairy a day, or if you are lactose intolerant take a calcium with vitamin D daily.  Your vaccines are up to date. Given Prevnar 20 today.     Return in about 1 year (around 08/05/2024) for Wellness Exam.     Duaine German, MD

## 2023-08-07 ENCOUNTER — Encounter: Payer: Self-pay | Admitting: Family Medicine

## 2023-08-07 DIAGNOSIS — E785 Hyperlipidemia, unspecified: Secondary | ICD-10-CM

## 2023-08-07 LAB — CBC
Hematocrit: 44.1 % (ref 34.0–46.6)
Hemoglobin: 14.9 g/dL (ref 11.1–15.9)
MCH: 31.5 pg (ref 26.6–33.0)
MCHC: 33.8 g/dL (ref 31.5–35.7)
MCV: 93 fL (ref 79–97)
Platelets: 217 10*3/uL (ref 150–450)
RBC: 4.73 x10E6/uL (ref 3.77–5.28)
RDW: 12.8 % (ref 11.7–15.4)
WBC: 6 10*3/uL (ref 3.4–10.8)

## 2023-08-07 LAB — CMP14+EGFR
ALT: 18 IU/L (ref 0–32)
AST: 25 IU/L (ref 0–40)
Albumin: 4.9 g/dL (ref 3.8–4.9)
Alkaline Phosphatase: 69 IU/L (ref 44–121)
BUN/Creatinine Ratio: 18 (ref 9–23)
BUN: 17 mg/dL (ref 6–24)
Bilirubin Total: 0.5 mg/dL (ref 0.0–1.2)
CO2: 22 mmol/L (ref 20–29)
Calcium: 9.9 mg/dL (ref 8.7–10.2)
Chloride: 102 mmol/L (ref 96–106)
Creatinine, Ser: 0.92 mg/dL (ref 0.57–1.00)
Globulin, Total: 2.2 g/dL (ref 1.5–4.5)
Glucose: 93 mg/dL (ref 70–99)
Potassium: 4.5 mmol/L (ref 3.5–5.2)
Sodium: 140 mmol/L (ref 134–144)
Total Protein: 7.1 g/dL (ref 6.0–8.5)
eGFR: 73 mL/min/{1.73_m2} (ref >59)

## 2023-08-07 LAB — LIPID PANEL
Chol/HDL Ratio: 2.8 ratio (ref 0.0–4.4)
Cholesterol, Total: 294 mg/dL — ABNORMAL HIGH (ref 100–199)
HDL: 106 mg/dL (ref >39)
LDL Chol Calc (NIH): 175 mg/dL — ABNORMAL HIGH (ref 0–99)
Triglycerides: 82 mg/dL (ref 0–149)
VLDL Cholesterol Cal: 13 mg/dL (ref 5–40)

## 2023-08-07 LAB — TSH: TSH: 2.01 u[IU]/mL (ref 0.450–4.500)

## 2023-08-09 ENCOUNTER — Ambulatory Visit: Payer: Self-pay | Admitting: Family Medicine

## 2023-08-10 NOTE — Progress Notes (Signed)
 Please call lab and see if we can add a direct LDL.

## 2023-08-11 LAB — LDL CHOLESTEROL, DIRECT: LDL Direct: 180 mg/dL — ABNORMAL HIGH (ref 0–99)

## 2023-08-11 LAB — SPECIMEN STATUS REPORT

## 2023-08-11 NOTE — Addendum Note (Signed)
 Addended by: Devra Stare D on: 08/11/2023 07:31 AM   Modules accepted: Orders

## 2023-08-13 NOTE — Progress Notes (Signed)
 Hi Shannon Carr, I had the lab add a direct LDL, I just question the accuracy of this result.   I did order the cardiac calcium score so the imaging dept should be contacting you soon to schedule whenever you are back this way.

## 2023-08-26 ENCOUNTER — Ambulatory Visit (INDEPENDENT_AMBULATORY_CARE_PROVIDER_SITE_OTHER): Payer: Self-pay

## 2023-08-26 DIAGNOSIS — E785 Hyperlipidemia, unspecified: Secondary | ICD-10-CM

## 2023-08-27 ENCOUNTER — Ambulatory Visit: Payer: Self-pay | Admitting: Family Medicine

## 2023-08-27 ENCOUNTER — Other Ambulatory Visit: Payer: Self-pay | Admitting: *Deleted

## 2023-08-27 NOTE — Progress Notes (Signed)
 Let's repeat LDL in 4 months.

## 2023-08-27 NOTE — Progress Notes (Signed)
 Great news!!!  Normal test!  Coronary calcium score of 0. This suggests low risk for future cardiac events.They will also do an over read on surrounding tissue as well.

## 2023-09-17 NOTE — Progress Notes (Signed)
 Hi Aniesa, we did receive that over read on the surrounding tissues on this CT calcium score.  Everything looked normal.

## 2023-10-15 ENCOUNTER — Other Ambulatory Visit: Payer: Self-pay | Admitting: Family Medicine

## 2023-10-15 ENCOUNTER — Encounter: Payer: Self-pay | Admitting: Family Medicine

## 2023-10-15 DIAGNOSIS — A609 Anogenital herpesviral infection, unspecified: Secondary | ICD-10-CM

## 2023-10-15 MED ORDER — ALBUTEROL SULFATE HFA 108 (90 BASE) MCG/ACT IN AERS: 2.0000 | INHALATION_SPRAY | Freq: Four times a day (QID) | RESPIRATORY_TRACT | 1 refills | Status: AC | PRN

## 2023-10-19 ENCOUNTER — Telehealth: Payer: Self-pay

## 2023-10-19 ENCOUNTER — Other Ambulatory Visit (HOSPITAL_COMMUNITY): Payer: Self-pay

## 2023-10-19 NOTE — Telephone Encounter (Signed)
 Pharmacy Patient Advocate Encounter   Received notification from RX Request Messages that prior authorization for Valacyclovir  1000mg  tabs is required/requested.   Insurance verification completed.   The patient is insured through [employer] .   Per test claim: PA required; PA submitted to above mentioned insurance via CoverMyMeds Key/confirmation #/EOC [id number] Status is pending

## 2023-10-22 ENCOUNTER — Other Ambulatory Visit (HOSPITAL_COMMUNITY): Payer: Self-pay

## 2023-10-22 NOTE — Telephone Encounter (Signed)
 Pharmacy Patient Advocate Encounter  Received notification from [facility] that Prior Authorization for Valacyclovir  1gm tabs has been CANCELLED due to available without authroization.    PA #/Case ID/Reference #: [id number]

## 2023-10-29 NOTE — Telephone Encounter (Signed)
 Prior authorization was send to [facility] for the medication. Here's there response:  Received notification from [facility] that Prior Authorization for Valacyclovir  1gm tabs has been CANCELLED due to available without authroization

## 2023-10-29 NOTE — Telephone Encounter (Signed)
 I am not sure about this.  I had sent a prescription for valacyclovir .  It is generic so it is ridiculous if her insurance is requiring a prior authorization which is highly recommend she use GoodRx.  With GoodRx she can get 30 tabs [facility] or [facility]18 [facility].

## 2023-11-08 NOTE — Telephone Encounter (Signed)
 This request has been handled. No further action is required. Please review other telephone encounters for additional information.

## 2023-11-23 ENCOUNTER — Encounter: Payer: Self-pay | Admitting: Sports Medicine

## 2023-12-30 DIAGNOSIS — J209 Acute bronchitis, unspecified: Secondary | ICD-10-CM | POA: Diagnosis not present

## 2023-12-30 DIAGNOSIS — J4521 Mild intermittent asthma with (acute) exacerbation: Secondary | ICD-10-CM | POA: Diagnosis not present

## 2023-12-30 DIAGNOSIS — J0100 Acute maxillary sinusitis, unspecified: Secondary | ICD-10-CM | POA: Diagnosis not present

## 2024-01-07 DIAGNOSIS — J45909 Unspecified asthma, uncomplicated: Secondary | ICD-10-CM | POA: Diagnosis not present

## 2024-01-07 DIAGNOSIS — R051 Acute cough: Secondary | ICD-10-CM | POA: Diagnosis not present

## 2024-01-13 ENCOUNTER — Telehealth (INDEPENDENT_AMBULATORY_CARE_PROVIDER_SITE_OTHER): Admitting: Family Medicine

## 2024-01-13 ENCOUNTER — Encounter: Payer: Self-pay | Admitting: Family Medicine

## 2024-01-13 DIAGNOSIS — J4541 Moderate persistent asthma with (acute) exacerbation: Secondary | ICD-10-CM | POA: Diagnosis not present

## 2024-01-13 DIAGNOSIS — J40 Bronchitis, not specified as acute or chronic: Secondary | ICD-10-CM

## 2024-01-13 DIAGNOSIS — E7800 Pure hypercholesterolemia, unspecified: Secondary | ICD-10-CM | POA: Diagnosis not present

## 2024-01-13 DIAGNOSIS — R052 Subacute cough: Secondary | ICD-10-CM

## 2024-01-13 MED ORDER — PREDNISONE 20 MG PO TABS: ORAL_TABLET | ORAL | 0 refills | Status: AC

## 2024-01-13 NOTE — Progress Notes (Signed)
 Pt reports 10/4 she felt l  10/9 102 temp flu/covid negative she did tele health given medication which help but now feels  Went to [facility] on 10/17 advir (this has helped),singular and predinsone but the prednisone  didn't go thru. She stated that she feels that the CXR need

## 2024-01-13 NOTE — Progress Notes (Signed)
 Acute Office Visit  Patient ID: Shannon Carr, female    DOB: 1965/06/29, 58 y.o.   MRN: [id number]  PCP: Alvan Dorothyann BIRCH, MD  No chief complaint on file.   Subjective:       Virtual Visit via Video Note  I connected with Shannon Carr on 01/13/24 at  3:00 PM EDT by a video enabled telemedicine application and verified that I am speaking with the correct person using two identifiers.   I discussed the limitations of evaluation and management by telemedicine and the availability of in person appointments. The patient expressed understanding and agreed to proceed.  Patient location: at home Provider location: in office    History of Present Illness  She reports on October 1 she started getting a sore scratchy throat and felt like maybe she was coming down with something but the sick she had laryngitis and was not feeling well and had started to cough.  By the ninth she ran a fever of 102 that night and says even her scalp hurt.  She ended up doing a [facility] visit and was given doxycycline for 7 days and prednisone  for 5 days.  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  Since then she has just felt not like she cannot quite get a deep breath.  She feels like her chest still is not feeling very open.  She then started having left-sided sharp chest pain and tightness and so went to a local urgent care on October 17.  They did do a chest x-ray which were by report was negative.  They started her on Advair 250/50 and gave her Singulair.  She does feel like the Advair and albuterol  helped a little bit but she still feels like her chest is tight and her voice is not quite right her cough is intense but mostly dry but just some occasional mucus production no more fever.  She is also due for repeat lipids and would like to have that done if she is able to come in next week.  ROS     Objective:    LMP 06/19/2019 (Approximate)    Physical Exam Vitals reviewed.   Constitutional:      Appearance: Normal appearance.  HENT:     Head: Normocephalic.  Pulmonary:     Effort: Pulmonary effort is normal.  Neurological:     Mental Status: She is alert and oriented to person, place, and time.  Psychiatric:        Mood and Affect: Mood normal.        Behavior: Behavior normal.       No results found for any visits on 01/13/24.     Assessment & Plan:   Problem List Items Addressed This Visit   None Visit Diagnoses       Subacute cough    -  Primary   Relevant Orders   DG Chest 2 View     Elevated LDL cholesterol level       Relevant Orders   Direct LDL   Lipid panel   Familial Hypercholesterolemia     Bronchitis         Moderate persistent asthma with exacerbation       Relevant Medications   SINGULAIR 10 MG tablet   fluticasone-salmeterol (ADVAIR DISKUS) 250-50 MCG/ACT AEPB   predniSONE  (DELTASONE ) 20 MG tablet       Will treat with another course of prednisone  but will do a taper.  Since she is not really  getting a lot of sputum or mucus and the fever seems to resolve her can hold off on a second round of antibiotics at this point.  If not improving after the weekend recommend repeat chest x-ray okay to restart Tessalon Perles she has some at home.  Due For repeat lipids and we will check them for familial hypercholesterolemia since last LDL was greater than 170.  Assessment and Plan Assessment & Plan     Meds ordered this encounter  Medications   predniSONE  (DELTASONE ) 20 MG tablet    Sig: Take 2 tablets (40 mg total) by mouth daily with breakfast for 4 days, THEN 1 tablet (20 mg total) daily with breakfast for 4 days, THEN 0.5 tablets (10 mg total) daily with breakfast for 4 days.    Dispense:  14 tablet    Refill:  0    No follow-ups on file.   I discussed the assessment and treatment plan with the patient. The patient was provided an opportunity to ask questions and all were answered. The patient agreed with the  plan and demonstrated an understanding of the instructions.   The patient was advised to call back or seek an in-person evaluation if the symptoms worsen or if the condition fails to improve as anticipated.  Dorothyann Byars, MD [facility] at [facility]

## 2024-02-09 DIAGNOSIS — D1801 Hemangioma of skin and subcutaneous tissue: Secondary | ICD-10-CM | POA: Diagnosis not present

## 2024-02-09 DIAGNOSIS — L821 Other seborrheic keratosis: Secondary | ICD-10-CM | POA: Diagnosis not present

## 2024-02-09 DIAGNOSIS — L814 Other melanin hyperpigmentation: Secondary | ICD-10-CM | POA: Diagnosis not present

## 2024-02-09 DIAGNOSIS — Z85828 Personal history of other malignant neoplasm of skin: Secondary | ICD-10-CM | POA: Diagnosis not present

## 2024-04-17 ENCOUNTER — Telehealth: Admitting: Family Medicine

## 2024-04-17 ENCOUNTER — Encounter: Payer: Self-pay | Admitting: Family Medicine

## 2024-04-17 VITALS — Wt 149.0 lb

## 2024-04-17 DIAGNOSIS — R635 Abnormal weight gain: Secondary | ICD-10-CM | POA: Diagnosis not present

## 2024-04-17 DIAGNOSIS — J029 Acute pharyngitis, unspecified: Secondary | ICD-10-CM

## 2024-04-17 DIAGNOSIS — Z713 Dietary counseling and surveillance: Secondary | ICD-10-CM | POA: Diagnosis not present

## 2024-04-17 DIAGNOSIS — R1310 Dysphagia, unspecified: Secondary | ICD-10-CM | POA: Diagnosis not present

## 2024-04-17 DIAGNOSIS — Z7689 Persons encountering health services in other specified circumstances: Secondary | ICD-10-CM | POA: Insufficient documentation

## 2024-04-17 MED ORDER — "NEEDLE (DISP) 30G X 1/2"" MISC": 0 refills | Status: AC

## 2024-04-17 MED ORDER — TIRZEPATIDE-WEIGHT MANAGEMENT 5 MG/0.5ML ~~LOC~~ SOLN: 5.0000 mg | SUBCUTANEOUS | 0 refills | Status: AC

## 2024-04-17 MED ORDER — PANTOPRAZOLE SODIUM 40 MG PO TBEC: 40.0000 mg | DELAYED_RELEASE_TABLET | Freq: Every day | ORAL | 1 refills | Status: AC

## 2024-04-17 MED ORDER — NYSTATIN 100000 UNIT/ML MT SUSP: 5.0000 mL | Freq: Four times a day (QID) | OROMUCOSAL | 0 refills | Status: AC

## 2024-04-17 NOTE — Progress Notes (Signed)
 "                    MyChart Video Visit    Virtual Visit via Video Note   This format is felt to be most appropriate for this patient at this time. Physical exam was limited by quality of the video and audio technology used for the visit.    Patient location: home Provider location: [facility] [place] - home b/o weather   Persons involved in the visit: patient, provider  I discussed the limitations of evaluation and management by telemedicine and the availability of in person appointments. The patient expressed understanding and agreed to proceed.  Patient: Shannon Carr   DOB: [birth year]   59 y.o. Female  MRN: [id number] Visit Date: 04/17/2024  Today's healthcare provider: Dorothyann Byars, MD   Chief Complaint  Patient presents with   Sore Throat        Subjective:    HPI  Under some increased stressors lately.   Mom has dementia and is now in assisted living.  This has been difficult and he rmom has been getting easily confused.    Past fall had a respiratory bug in the fall and it was severe. Took doxy prednisone  and advair. Having odynophagia, back posterior soft pallet area. It has felt painful. No GERD.  No excess drip or drainage, no cobblestoning.  Such a forceful cough that occ vomiting.  No swollen LNs.  No coating on back of throat.    Voice changes as well that started when sick.    Would also like to discuss weight loss. Has gained 12 lbs since the fall and being sick. Was working out on [place] and eating really well.  Has started exercising again but not weight loss.  Stress has been additive too.  Goal of 135 lbs, todays weight 149 lbs   Husband retired and then went back to work in [employer].    ROS       Objective:    Wt 149 lb (67.6 kg)   LMP 06/19/2019   BMI 24.05 kg/m       Physical Exam Vitals reviewed.  Constitutional:      Appearance: Normal appearance.  HENT:     Head:  Normocephalic.  Pulmonary:     Effort: Pulmonary effort is normal.  Neurological:     Mental Status: She is alert and oriented to person, place, and time.  Psychiatric:        Mood and Affect: Mood normal.        Behavior: Behavior normal.         Assessment & Plan:    Problem List Items Addressed This Visit       Other   Encounter for weight management   Visit #:1 Starting Weight:149 lb  Current weight: 149 lb  Previous weight: Change in weight: Goal weight: 135 lb  Dietary goals: continue to work on inc protein and low carb Exercise goals: continue execise 3-5 days per week.  Medication: Start Mounjaro  2.5mg  weekly Follow-up and referrals: 4 weeks.        Relevant Medications   tirzepatide  5 MG/0.5ML injection vial   Other Visit Diagnoses       Sore throat    -  Primary     Painful swallowing         Weight gain       Relevant Medications   tirzepatide  5 MG/0.5ML injection vial  Assessment and Plan Assessment & Plan  Sore throat - consider possible thrush vs GERD vs lesion. Will tx with nysatatin swish and swallow and trial of PPI for 2-3 weeks. If not resolving then will refer to ENT for further workup.     Meds ordered this encounter  Medications   nystatin  (MYCOSTATIN ) 100000 UNIT/ML suspension    Sig: Take 5 mLs (500,000 Units total) by mouth 4 (four) times daily. X 1 week. Swish and hold in mouth for at least 2 minutes and then swallow.    Dispense:  473 mL    Refill:  0   pantoprazole  (PROTONIX ) 40 MG tablet    Sig: Take 1 tablet (40 mg total) by mouth daily.    Dispense:  30 tablet    Refill:  1   NEEDLE, DISP, 30 G 30G X 1/2 MISC    Sig: Inject medication subcutaneous weekly.    Dispense:  50 each    Refill:  0   tirzepatide  5 MG/0.5ML injection vial    Sig: Inject 5 mg into the skin once a week.    Dispense:  2 mL    Refill:  0     Return in about 4 weeks (around 05/15/2024), or Wt mgt.     I discussed the assessment and  treatment plan with the patient. The patient was provided an opportunity to ask questions and all were answered. The patient agreed with the plan and demonstrated an understanding of the instructions.   The patient was advised to call back or seek an in-person evaluation if the symptoms worsen or if the condition fails to improve as anticipated.  I provided 30 minutes of non-face-to-face time during this encounter.  Dorothyann Byars, MD [facility] at [facility]    "

## 2024-04-17 NOTE — Assessment & Plan Note (Signed)
 Visit #:1 Starting Weight:149 lb  Current weight: 149 lb  Previous weight: Change in weight: Goal weight: 135 lb  Dietary goals: continue to work on inc protein and low carb Exercise goals: continue execise 3-5 days per week.  Medication: Start Mounjaro  2.5mg  weekly Follow-up and referrals: 4 weeks.
